# Patient Record
Sex: Female | Born: 2000 | Race: Black or African American | Hispanic: No | Marital: Single | Smoking: Never smoker
Health system: Southern US, Community
[De-identification: ages and names within clinical notes are randomized; demographics above are authoritative.]

## PROBLEM LIST (undated history)

## (undated) ENCOUNTER — Inpatient Hospital Stay: Payer: Self-pay

## (undated) DIAGNOSIS — O368120 Decreased fetal movements, second trimester, not applicable or unspecified: Secondary | ICD-10-CM

## (undated) DIAGNOSIS — M722 Plantar fascial fibromatosis: Secondary | ICD-10-CM

## (undated) DIAGNOSIS — Z87442 Personal history of urinary calculi: Secondary | ICD-10-CM

## (undated) DIAGNOSIS — K219 Gastro-esophageal reflux disease without esophagitis: Secondary | ICD-10-CM

## (undated) DIAGNOSIS — R519 Headache, unspecified: Secondary | ICD-10-CM

## (undated) DIAGNOSIS — F319 Bipolar disorder, unspecified: Secondary | ICD-10-CM

## (undated) DIAGNOSIS — D649 Anemia, unspecified: Secondary | ICD-10-CM

## (undated) DIAGNOSIS — I809 Phlebitis and thrombophlebitis of unspecified site: Secondary | ICD-10-CM

## (undated) DIAGNOSIS — J45909 Unspecified asthma, uncomplicated: Secondary | ICD-10-CM

## (undated) DIAGNOSIS — T8859XA Other complications of anesthesia, initial encounter: Secondary | ICD-10-CM

## (undated) DIAGNOSIS — F419 Anxiety disorder, unspecified: Secondary | ICD-10-CM

## (undated) HISTORY — PX: TONSILLECTOMY: SUR1361

## (undated) HISTORY — DX: Decreased fetal movements, second trimester, not applicable or unspecified: O36.8120

## (undated) HISTORY — DX: Plantar fascial fibromatosis: M72.2

---
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
## 2019-01-18 ENCOUNTER — Encounter: Payer: Medicaid Other | Admitting: Obstetrics and Gynecology

## 2019-02-26 ENCOUNTER — Other Ambulatory Visit: Payer: Self-pay

## 2019-02-26 ENCOUNTER — Emergency Department
Admission: EM | Admit: 2019-02-26 | Discharge: 2019-02-26 | Disposition: A | Discharge status: Home / Self Care | Payer: Medicaid Other | Attending: Emergency Medicine | Admitting: Emergency Medicine

## 2019-02-26 DIAGNOSIS — Z20822 Contact with and (suspected) exposure to covid-19: Secondary | ICD-10-CM | POA: Diagnosis not present

## 2019-02-26 DIAGNOSIS — R0981 Nasal congestion: Secondary | ICD-10-CM | POA: Diagnosis present

## 2019-02-26 DIAGNOSIS — J45909 Unspecified asthma, uncomplicated: Secondary | ICD-10-CM | POA: Insufficient documentation

## 2019-02-26 DIAGNOSIS — R05 Cough: Secondary | ICD-10-CM | POA: Diagnosis not present

## 2019-02-26 DIAGNOSIS — J069 Acute upper respiratory infection, unspecified: Secondary | ICD-10-CM | POA: Diagnosis not present

## 2019-02-26 NOTE — ED Notes (Signed)
This RN to bedside to go over d/c paperwork and do vitals, pt not in room nor is her daughter. Will reassess.

## 2019-02-26 NOTE — ED Notes (Signed)
PT denies trouble breathing or fever

## 2019-02-26 NOTE — ED Provider Notes (Signed)
Emergency Department Provider Note  ____________________________________________  Time seen: Approximately 8:34 PM  I have reviewed the triage vital signs and the nursing notes.   HISTORY  Chief Complaint Nasal Congestion and Cough   Historian Patient    HPI Joanna [name] is a 19 y.o. female presents to the emergency department with nasal congestions, sporadic cough and rhinorrhea for the past 24 hours.  Patient's daughter has similar symptoms.  No fever noted at home.  No chest pain, chest tightness or abdominal pain.  No emesis or diarrhea.  No other alleviating measures have been attempted.   Past Medical History:  Diagnosis Date  . Anxiety   . Asthma   . Bipolar 1 disorder ([place])   . Thrombophlebitis 02/2017   septic pelvic venous thrombophlebitis     Immunizations up to date:  Yes.     Past Medical History:  Diagnosis Date  . Anxiety   . Asthma   . Bipolar 1 disorder ([place])   . Thrombophlebitis 02/2017   septic pelvic venous thrombophlebitis    Patient Active Problem List   Diagnosis Date Noted  . Postoperative wound infection   . Sepsis ([place]) 02/03/2017  . Labor and delivery indication for care or intervention 01/28/2017  . Pregnant 12/24/2016  . Positive urine drug screen 07/09/2016  . Morbid obesity with BMI of 40.0-44.9, adult ([place]) 07/09/2016  . High risk teen pregnancy 07/09/2016  . Asthma 09/27/2012    Past Surgical History:  Procedure Laterality Date  . CESAREAN SECTION N/A 01/28/2017   Procedure: CESAREAN SECTION;  Surgeon: Harlin Heys, MD;  Location: [facility] ORS;  Service: Obstetrics;  Laterality: N/A;  . CYSTOSCOPY W/ RETROGRADES Right 03/26/2017   Procedure: CYSTOSCOPY WITH RETROGRADE PYELOGRAM;  Surgeon: Abbie Sons, MD;  Location: [facility] ORS;  Service: Urology;  Laterality: Right;  . CYSTOSCOPY W/ URETERAL STENT PLACEMENT Right 02/06/2017   Procedure: CYSTOSCOPY WITH RETROGRADE PYELOGRAM/URETERAL STENT PLACEMENT;  Surgeon:  Abbie Sons, MD;  Location: [facility] ORS;  Service: Urology;  Laterality: Right;  . CYSTOSCOPY W/ URETERAL STENT REMOVAL Right 03/26/2017   Procedure: CYSTOSCOPY WITH STENT REMOVAL;  Surgeon: Abbie Sons, MD;  Location: [facility] ORS;  Service: Urology;  Laterality: Right;  . TONSILLECTOMY      Prior to Admission medications   Not on File    Allergies Tylenol [acetaminophen]  Family History  Problem Relation Age of Onset  . Diabetes Mother   . Hypertension Mother   . Cancer Maternal Aunt   . Stroke Maternal Grandmother   . Thyroid disease Maternal Grandmother   . Breast cancer Paternal Grandmother   . Hyperlipidemia Maternal Grandfather   . Diabetes Maternal Grandfather   . Chronic Renal Failure Maternal Grandfather   . Heart Problems Maternal Grandfather        pacemaker   . Hypertension Maternal Grandfather   . Diabetes Paternal Grandfather   . Heart Problems Brother   . Asthma Brother     Social History Social History   Tobacco Use  . Smoking status: Never Smoker  . Smokeless tobacco: Never Used  Substance Use Topics  . Alcohol use: No  . Drug use: No     Review of Systems  Constitutional: No fever/chills Eyes:  No discharge ENT: No upper respiratory complaints. Respiratory: Patient has nasal congestion and sporadic cough. Gastrointestinal:   No nausea, no vomiting.  No diarrhea.  No constipation. Musculoskeletal: Negative for musculoskeletal pain. Skin: Negative for rash, abrasions, lacerations, ecchymosis.    ____________________________________________  PHYSICAL EXAM:  VITAL SIGNS: ED Triage Vitals  Enc Vitals Group     BP 02/26/19 2014 (!) 145/56     Pulse Rate 02/26/19 2014 90     Resp 02/26/19 2014 18     Temp 02/26/19 2014 98.6 F (37 C)     Temp Source 02/26/19 2014 Oral     SpO2 02/26/19 2014 100 %     Weight 02/26/19 2013 (!) 313 lb (142 kg)     Height 02/26/19 2013 5\' 10"  (1.778 m)     Head Circumference --      Peak Flow --       Pain Score 02/26/19 2013 0     Pain Loc --      Pain Edu? --      Excl. in [place]? --      Constitutional: Alert and oriented. Patient is lying supine. Eyes: Conjunctivae are normal. PERRL. EOMI. Head: Atraumatic. ENT:      Ears: Tympanic membranes are mildly injected with mild effusion bilaterally.       Nose: No congestion/rhinnorhea.      Mouth/Throat: Mucous membranes are moist. Posterior pharynx is mildly erythematous.  Hematological/Lymphatic/Immunilogical: No cervical lymphadenopathy.  Cardiovascular: Normal rate, regular rhythm. Normal S1 and S2.  Good peripheral circulation. Respiratory: Normal respiratory effort without tachypnea or retractions. Lungs CTAB. Good air entry to the bases with no decreased or absent breath sounds. Gastrointestinal: Bowel sounds 4 quadrants. Soft and nontender to palpation. No guarding or rigidity. No palpable masses. No distention. No CVA tenderness. Musculoskeletal: Full range of motion to all extremities. No gross deformities appreciated. Neurologic:  Normal speech and language. No gross focal neurologic deficits are appreciated.  Skin:  Skin is warm, dry and intact. No rash noted. Psychiatric: Mood and affect are normal. Speech and behavior are normal. Patient exhibits appropriate insight and judgement.   ____________________________________________   LABS (all labs ordered are listed, but only abnormal results are displayed)  Labs Reviewed  SARS CORONAVIRUS 2 (TAT 6-24 HRS)   ____________________________________________  EKG   ____________________________________________  RADIOLOGY   No results found.  ____________________________________________    PROCEDURES  Procedure(s) performed:     Procedures     Medications - No data to display   ____________________________________________   INITIAL IMPRESSION / ASSESSMENT AND PLAN / ED COURSE  Pertinent labs & imaging results that were available during my care of  the patient were reviewed by me and considered in my medical decision making (see chart for details).      Assessment and plan Need for COVID-19 testing. 19 year old female presents to the emergency department with rhinorrhea, nasal congestion and sporadic cough for the past 24 hours requesting COVID-19 testing.  Vital signs are reassuring at triage.  Sent off COVID-19 testing is pending at this time.  Quarantine precautions were given.  All patient questions were answered.    ____________________________________________  FINAL CLINICAL IMPRESSION(S) / ED DIAGNOSES  Final diagnoses:  None      NEW MEDICATIONS STARTED DURING THIS VISIT:  ED Discharge Orders    None          This chart was dictated using voice recognition software/Dragon. Despite best efforts to proofread, errors can occur which can change the meaning. Any change was purely unintentional.     Karren Cobble 02/26/19 2039    Arta Silence, MD 02/26/19 2229

## 2019-02-26 NOTE — ED Triage Notes (Signed)
Patient reports runny nose and cough that started yesterday.

## 2019-02-27 LAB — SARS CORONAVIRUS 2 (TAT 6-24 HRS): SARS Coronavirus 2: NEGATIVE

## 2019-08-08 ENCOUNTER — Emergency Department
Admission: EM | Admit: 2019-08-08 | Discharge: 2019-08-08 | Disposition: A | Discharge status: Home / Self Care | Payer: Medicaid Other | Attending: Emergency Medicine | Admitting: Emergency Medicine

## 2019-08-08 ENCOUNTER — Other Ambulatory Visit: Payer: Self-pay

## 2019-08-08 ENCOUNTER — Encounter: Payer: Self-pay | Admitting: Emergency Medicine

## 2019-08-08 DIAGNOSIS — R519 Headache, unspecified: Secondary | ICD-10-CM | POA: Diagnosis present

## 2019-08-08 DIAGNOSIS — K219 Gastro-esophageal reflux disease without esophagitis: Secondary | ICD-10-CM | POA: Diagnosis not present

## 2019-08-08 DIAGNOSIS — G44209 Tension-type headache, unspecified, not intractable: Secondary | ICD-10-CM | POA: Diagnosis not present

## 2019-08-08 DIAGNOSIS — J45909 Unspecified asthma, uncomplicated: Secondary | ICD-10-CM | POA: Diagnosis not present

## 2019-08-08 DIAGNOSIS — H53149 Visual discomfort, unspecified: Secondary | ICD-10-CM | POA: Diagnosis not present

## 2019-08-08 LAB — COMPREHENSIVE METABOLIC PANEL
ALT: 11 U/L (ref 0–44)
AST: 12 U/L — ABNORMAL LOW (ref 15–41)
Albumin: 3.6 g/dL (ref 3.5–5.0)
Alkaline Phosphatase: 59 U/L (ref 38–126)
Anion gap: 6 (ref 5–15)
BUN: 13 mg/dL (ref 6–20)
CO2: 27 mmol/L (ref 22–32)
Calcium: 8.7 mg/dL — ABNORMAL LOW (ref 8.9–10.3)
Chloride: 106 mmol/L (ref 98–111)
Creatinine, Ser: 0.75 mg/dL (ref 0.44–1.00)
GFR calc Af Amer: >60 mL/min (ref >60)
GFR calc non Af Amer: >60 mL/min (ref >60)
Glucose, Bld: 105 mg/dL — ABNORMAL HIGH (ref 70–99)
Potassium: 4 mmol/L (ref 3.5–5.1)
Sodium: 139 mmol/L (ref 135–145)
Total Bilirubin: 0.6 mg/dL (ref 0.3–1.2)
Total Protein: 6.8 g/dL (ref 6.5–8.1)

## 2019-08-08 LAB — CBC
HCT: 34.1 % — ABNORMAL LOW (ref 36.0–46.0)
Hemoglobin: 11.2 g/dL — ABNORMAL LOW (ref 12.0–15.0)
MCH: 25.9 pg — ABNORMAL LOW (ref 26.0–34.0)
MCHC: 32.8 g/dL (ref 30.0–36.0)
MCV: 78.9 fL — ABNORMAL LOW (ref 80.0–100.0)
Platelets: 242 10*3/uL (ref 150–400)
RBC: 4.32 MIL/uL (ref 3.87–5.11)
RDW: 17.3 % — ABNORMAL HIGH (ref 11.5–15.5)
WBC: 5.9 10*3/uL (ref 4.0–10.5)
nRBC: 0 % (ref 0.0–0.2)

## 2019-08-08 LAB — URINALYSIS, COMPLETE (UACMP) WITH MICROSCOPIC
Bacteria, UA: NONE SEEN
Bilirubin Urine: NEGATIVE
Glucose, UA: NEGATIVE mg/dL
Hgb urine dipstick: NEGATIVE
Ketones, ur: NEGATIVE mg/dL
Leukocytes,Ua: NEGATIVE
Nitrite: NEGATIVE
Protein, ur: NEGATIVE mg/dL
Specific Gravity, Urine: 1.017 (ref 1.005–1.030)
pH: 7 (ref 5.0–8.0)

## 2019-08-08 LAB — LIPASE, BLOOD: Lipase: 21 U/L (ref 11–51)

## 2019-08-08 LAB — POCT PREGNANCY, URINE: Preg Test, Ur: NEGATIVE

## 2019-08-08 MED ORDER — FAMOTIDINE 20 MG PO TABS: 20.0000 mg | ORAL_TABLET | Freq: Two times a day (BID) | ORAL | 1 refills | Status: DC

## 2019-08-08 MED ORDER — DIAZEPAM 2 MG PO TABS: 2.0000 mg | ORAL_TABLET | Freq: Every evening | ORAL | 0 refills | Status: DC

## 2019-08-08 MED ORDER — KETOROLAC TROMETHAMINE 60 MG/2ML IM SOLN
60.0000 mg | Freq: Once | INTRAMUSCULAR | Status: AC
  Administered 2019-08-08: 60 mg via INTRAMUSCULAR
  Filled 2019-08-08: qty 2

## 2019-08-08 MED ORDER — DICYCLOMINE HCL 10 MG/5ML PO SOLN
10.0000 mg | Freq: Once | ORAL | Status: AC
  Administered 2019-08-08: 10 mg via ORAL
  Filled 2019-08-08: qty 5

## 2019-08-08 MED ORDER — LIDOCAINE VISCOUS HCL 2 % MT SOLN
15.0000 mL | Freq: Once | OROMUCOSAL | Status: AC
  Administered 2019-08-08: 15 mL via ORAL
  Filled 2019-08-08: qty 15

## 2019-08-08 MED ORDER — ALUM & MAG HYDROXIDE-SIMETH 200-200-20 MG/5ML PO SUSP
30.0000 mL | Freq: Once | ORAL | Status: AC
  Administered 2019-08-08: 30 mL via ORAL
  Filled 2019-08-08: qty 30

## 2019-08-08 NOTE — ED Provider Notes (Signed)
[facility] Emergency Department Provider Note   ____________________________________________   First MD Initiated Contact with Patient 08/08/19 1231     (approximate)  I have reviewed the triage vital signs and the nursing notes.   HISTORY  Chief Complaint Headache    HPI Joanna [name] is a 19 y.o. female who complains of burning epigastric pain for the last day or so.  It is mild to moderate in the epigastric area only there is no nausea vomiting or diarrhea with it is not crampy.  She says she uses herbs and sometimes this seems to help a little bit.  She also has had a headache for the last 4 days it is kind of diffuse tension any kind of headache radiating down towards the neck.  It is not severe 3 or 4 out of 10.  She has a slight amount of photophobia with it but no fever no pain with eye movements neck is supple there is no pain in her neck.  Her herbal treatments have not helped the headache.         Past Medical History:  Diagnosis Date  . Anxiety   . Asthma   . Bipolar 1 disorder ([place])   . Thrombophlebitis 02/2017   septic pelvic venous thrombophlebitis    Patient Active Problem List   Diagnosis Date Noted  . Postoperative wound infection   . Sepsis ([place]) 02/03/2017  . Labor and delivery indication for care or intervention 01/28/2017  . Pregnant 12/24/2016  . Positive urine drug screen 07/09/2016  . Morbid obesity with BMI of 40.0-44.9, adult ([place]) 07/09/2016  . High risk teen pregnancy 07/09/2016  . Asthma 09/27/2012    Past Surgical History:  Procedure Laterality Date  . CESAREAN SECTION N/A 01/28/2017   Procedure: CESAREAN SECTION;  Surgeon: Harlin Heys, MD;  Location: [facility] ORS;  Service: Obstetrics;  Laterality: N/A;  . CYSTOSCOPY W/ RETROGRADES Right 03/26/2017   Procedure: CYSTOSCOPY WITH RETROGRADE PYELOGRAM;  Surgeon: Abbie Sons, MD;  Location: [facility] ORS;  Service: Urology;  Laterality: Right;  .  CYSTOSCOPY W/ URETERAL STENT PLACEMENT Right 02/06/2017   Procedure: CYSTOSCOPY WITH RETROGRADE PYELOGRAM/URETERAL STENT PLACEMENT;  Surgeon: Abbie Sons, MD;  Location: [facility] ORS;  Service: Urology;  Laterality: Right;  . CYSTOSCOPY W/ URETERAL STENT REMOVAL Right 03/26/2017   Procedure: CYSTOSCOPY WITH STENT REMOVAL;  Surgeon: Abbie Sons, MD;  Location: [facility] ORS;  Service: Urology;  Laterality: Right;  . TONSILLECTOMY      Prior to Admission medications   Not on File    Allergies Tylenol [acetaminophen]  Family History  Problem Relation Age of Onset  . Diabetes Mother   . Hypertension Mother   . Cancer Maternal Aunt   . Stroke Maternal Grandmother   . Thyroid disease Maternal Grandmother   . Breast cancer Paternal Grandmother   . Hyperlipidemia Maternal Grandfather   . Diabetes Maternal Grandfather   . Chronic Renal Failure Maternal Grandfather   . Heart Problems Maternal Grandfather        pacemaker   . Hypertension Maternal Grandfather   . Diabetes Paternal Grandfather   . Heart Problems Brother   . Asthma Brother     Social History Social History   Tobacco Use  . Smoking status: Never Smoker  . Smokeless tobacco: Never Used  Vaping Use  . Vaping Use: Never used  Substance Use Topics  . Alcohol use: No  . Drug use: No    Review of  Systems  Constitutional: No fever/chills Eyes: No visual changes. ENT: No sore throat. Cardiovascular: Denies chest pain. Respiratory: Denies shortness of breath. Gastrointestinal: No abdominal pain.  No nausea, no vomiting.  No diarrhea.  No constipation. Genitourinary: Negative for dysuria. Musculoskeletal: Negative for back pain. Skin: Negative for rash. Neurological: Negative for headaches, focal weakness  ____________________________________________   PHYSICAL EXAM:  VITAL SIGNS: ED Triage Vitals  Enc Vitals Group     BP 08/08/19 1055 (!) 107/88     Pulse Rate 08/08/19 1055 71     Resp 08/08/19 1055 18      Temp 08/08/19 1055 98.2 F (36.8 C)     Temp Source 08/08/19 1055 Oral     SpO2 08/08/19 1055 99 %     Weight 08/08/19 1056 (!) 356 lb (161.5 kg)     Height 08/08/19 1056 5\' 9"  (1.753 m)     Head Circumference --      Peak Flow --      Pain Score 08/08/19 1055 7     Pain Loc --      Pain Edu? --      Excl. in [place]? --     Constitutional: Alert and oriented. Well appearing and in no acute distress. Eyes: Conjunctivae are normal. PERRL. EOMI. fundi appear normal Head: Atraumatic. Nose: No congestion/rhinnorhea. Mouth/Throat: Mucous membranes are moist.  Oropharynx non-erythematous. Neck: No stridor.  Cardiovascular: Normal rate, regular rhythm. Grossly normal heart sounds.  Good peripheral circulation. Respiratory: Normal respiratory effort.  No retractions. Lungs CTAB. Gastrointestinal: Soft a palpation in the epigastric area somewhat tender there is no other tenderness to palpation or percussion no distention. No abdominal bruits.  Musculoskeletal: No lower extremity tenderness nor edema.   Neurologic:  Normal speech and language. No gross focal neurologic deficits are appreciated.  Cranial nerves II through XII are intact although visual fields were not checked cerebellar finger-nose rapid alternating movements and hands are normal patient does not report any numbness and motor strength is 5/5 throughout. Skin:  Skin is warm, dry and intact. No rash noted.   ____________________________________________   LABS (all labs ordered are listed, but only abnormal results are displayed)  Labs Reviewed  COMPREHENSIVE METABOLIC PANEL - Abnormal; Notable for the following components:      Result Value   Glucose, Bld 105 (*)    Calcium 8.7 (*)    AST 12 (*)    All other components within normal limits  CBC - Abnormal; Notable for the following components:   Hemoglobin 11.2 (*)    HCT 34.1 (*)    MCV 78.9 (*)    MCH 25.9 (*)    RDW 17.3 (*)    All other components within normal  limits  URINALYSIS, COMPLETE (UACMP) WITH MICROSCOPIC - Abnormal; Notable for the following components:   Color, Urine YELLOW (*)    APPearance HAZY (*)    All other components within normal limits  LIPASE, BLOOD  POC URINE PREG, ED  POCT PREGNANCY, URINE   ____________________________________________  EKG   ____________________________________________  RADIOLOGY  ED MD interpretation:    Official radiology report(s): No results found.  ____________________________________________   PROCEDURES  Procedure(s) performed (including Critical Care):  Procedures   ____________________________________________   INITIAL IMPRESSION / ASSESSMENT AND PLAN / ED COURSE  Patient's burning epigastric pain is not made worse by palpation it is in the epigastric area not in the right upper quadrant and she has a normal set of liver function test so I  do not believe that there is any liver or gallbladder problem involved in this.  She is not short of breath and has no chest pain so it is unlikely at her age if she has any cardiac issues either.  This is most likely reflux.  Her headache is dull and achy.  She has no fever nausea or vomiting.  There is no reason to be suspicious for meningitis.  Her fundi are normal with no papilledema.  Neurological exam is also normal.  Review of her old records does not show any episodes of migraine headache.  This does not sound like a migraine headache is lasted through several nights when she slept.  Patient is not nauseated or vomiting.  There is no reason to suspect any intracranial pathology with a normal neuro exam and no papilledema.  Most likely it is a tension headache.  We will try some GI cocktail and Toradol to see if this helps with her problems.  If they do we will let her go with some Tylenol for the headache and Pepcid for a few days for the reflux.  Additionally we will give her some reflux  instructions. ----------------------------------------- 1:55 PM on 08/08/2019 -----------------------------------------  Patient reports her epigastric pain is gone.  Her headache is somewhat better.  She now has some pain in the neck.  On palpation of her neck her neck muscles are extremely tense.  Palpation of the neck muscles and shoulder area/trapezius is tender and shows that her muscles are very tense.  Discussed with her.  We will give her a low-dose of Valium to take before bed to see if that helps for the next couple nights.  We will also give her some Pepcid for her stomach.             ____________________________________________   FINAL CLINICAL IMPRESSION(S) / ED DIAGNOSES  Final diagnoses:  Gastroesophageal reflux disease, unspecified whether esophagitis present  Tension headache     ED Discharge Orders    None       Note:  This document was prepared using Dragon voice recognition software and may include unintentional dictation errors.    Nena Polio, MD 08/08/19 1356

## 2019-08-08 NOTE — ED Triage Notes (Signed)
Here for headache for 4 days.  Also c/o abdominal cramping and has missed period this month and did not have normal period last month. Has not done  Home pregnancy test. NAD. VSS. No fever. + nausea without vomiting.

## 2019-08-08 NOTE — ED Notes (Signed)
See triage note  Presents with a 4 day hx of headache  States pain is mainly frontal and at both temporal areas denies any fever    Ha had some nausea

## 2019-08-08 NOTE — Discharge Instructions (Addendum)
Use Tylenol as needed for the headache.  Use the Pepcid 1 twice a day for the epigastric/upper stomach pain.  For that muscle tightness in your neck I will have you take 1 Valium before bed every night.  We will do this for 3 nights.  That should help.  If you take it before bed you should be okay during the day.  If you are sleepy or groggy do not drive or operate hazardous machinery.  Please return if worse or no better in 2 to 3 days.  Follow-up with your regular doctor.

## 2019-09-05 ENCOUNTER — Encounter: Payer: Medicaid Other | Admitting: Obstetrics and Gynecology

## 2019-09-21 ENCOUNTER — Encounter: Payer: Self-pay | Admitting: Obstetrics and Gynecology

## 2019-12-04 ENCOUNTER — Encounter: Payer: Self-pay | Admitting: Emergency Medicine

## 2019-12-04 ENCOUNTER — Emergency Department
Admission: EM | Admit: 2019-12-04 | Discharge: 2019-12-04 | Disposition: A | Discharge status: Home / Self Care | Payer: Medicaid Other | Attending: Emergency Medicine | Admitting: Emergency Medicine

## 2019-12-04 ENCOUNTER — Emergency Department: Payer: Medicaid Other

## 2019-12-04 ENCOUNTER — Other Ambulatory Visit: Payer: Self-pay

## 2019-12-04 DIAGNOSIS — R109 Unspecified abdominal pain: Secondary | ICD-10-CM | POA: Insufficient documentation

## 2019-12-04 DIAGNOSIS — J45909 Unspecified asthma, uncomplicated: Secondary | ICD-10-CM | POA: Diagnosis not present

## 2019-12-04 DIAGNOSIS — O219 Vomiting of pregnancy, unspecified: Secondary | ICD-10-CM | POA: Diagnosis not present

## 2019-12-04 DIAGNOSIS — R059 Cough, unspecified: Secondary | ICD-10-CM | POA: Insufficient documentation

## 2019-12-04 DIAGNOSIS — Z20822 Contact with and (suspected) exposure to covid-19: Secondary | ICD-10-CM | POA: Insufficient documentation

## 2019-12-04 DIAGNOSIS — R52 Pain, unspecified: Secondary | ICD-10-CM

## 2019-12-04 DIAGNOSIS — Z3A01 Less than 8 weeks gestation of pregnancy: Secondary | ICD-10-CM | POA: Insufficient documentation

## 2019-12-04 DIAGNOSIS — R531 Weakness: Secondary | ICD-10-CM | POA: Insufficient documentation

## 2019-12-04 DIAGNOSIS — O26891 Other specified pregnancy related conditions, first trimester: Secondary | ICD-10-CM | POA: Diagnosis present

## 2019-12-04 DIAGNOSIS — Z3491 Encounter for supervision of normal pregnancy, unspecified, first trimester: Secondary | ICD-10-CM

## 2019-12-04 LAB — URINALYSIS, COMPLETE (UACMP) WITH MICROSCOPIC
Bilirubin Urine: NEGATIVE
Glucose, UA: NEGATIVE mg/dL
Hgb urine dipstick: NEGATIVE
Ketones, ur: 5 mg/dL — AB
Nitrite: NEGATIVE
Protein, ur: NEGATIVE mg/dL
Specific Gravity, Urine: 1.024 (ref 1.005–1.030)
pH: 6 (ref 5.0–8.0)

## 2019-12-04 LAB — CBC
HCT: 34.2 % — ABNORMAL LOW (ref 36.0–46.0)
Hemoglobin: 11 g/dL — ABNORMAL LOW (ref 12.0–15.0)
MCH: 25.7 pg — ABNORMAL LOW (ref 26.0–34.0)
MCHC: 32.2 g/dL (ref 30.0–36.0)
MCV: 79.9 fL — ABNORMAL LOW (ref 80.0–100.0)
Platelets: 220 10*3/uL (ref 150–400)
RBC: 4.28 MIL/uL (ref 3.87–5.11)
RDW: 16.7 % — ABNORMAL HIGH (ref 11.5–15.5)
WBC: 7 10*3/uL (ref 4.0–10.5)
nRBC: 0 % (ref 0.0–0.2)

## 2019-12-04 LAB — RESP PANEL BY RT-PCR (FLU A&B, COVID) ARPGX2
Influenza A by PCR: NEGATIVE
Influenza B by PCR: NEGATIVE
SARS Coronavirus 2 by RT PCR: NEGATIVE

## 2019-12-04 LAB — POC URINE PREG, ED: Preg Test, Ur: POSITIVE — AB

## 2019-12-04 LAB — HCG, QUANTITATIVE, PREGNANCY: hCG, Beta Chain, Quant, S: 152025 m[IU]/mL — ABNORMAL HIGH (ref <5)

## 2019-12-04 LAB — ABO/RH: ABO/RH(D): B POS

## 2019-12-04 NOTE — ED Triage Notes (Signed)
Says feeling bad since last Thursday or Friday with cough, feeling weak, nauseated, vomiting all week after eating.  Not sure if she is pregnant.  Also got second dose of covid vaccine 11/9.  She sounds nasally congested.

## 2019-12-04 NOTE — Discharge Instructions (Addendum)
Follow-up with the [facility], your regular OB/GYN, or Dr. Marcelline Mates.  You could also follow-up at [facility] if desired.  If you plan to keep the pregnancy please take over-the-counter prenatal pills.

## 2019-12-04 NOTE — ED Provider Notes (Signed)
[facility] Emergency Department Provider Note  ____________________________________________   First MD Initiated Contact with Patient 12/04/19 1518     (approximate)  I have reviewed the triage vital signs and the nursing notes.   HISTORY  Chief Complaint Cough, Weakness, and Shortness of Breath    HPI Joanna [name] is a 19 y.o. female presents emergency department complaining of cough, feeling weak and nauseated, vomiting all week after eating.  She denies any fever or chills.  Patient states her last period was in September.  History of unprotected sex.  States she does not want to be pregnant.  She has had minimal abdominal pain.  She has not had any prenatal care or any pregnancy test so far.    Past Medical History:  Diagnosis Date  . Anxiety   . Asthma   . Bipolar 1 disorder ([place])   . Thrombophlebitis 02/2017   septic pelvic venous thrombophlebitis    Patient Active Problem List   Diagnosis Date Noted  . Postoperative wound infection   . Sepsis ([place]) 02/03/2017  . Labor and delivery indication for care or intervention 01/28/2017  . Pregnant 12/24/2016  . Positive urine drug screen 07/09/2016  . Morbid obesity with BMI of 40.0-44.9, adult ([place]) 07/09/2016  . High risk teen pregnancy 07/09/2016  . Asthma 09/27/2012    Past Surgical History:  Procedure Laterality Date  . CESAREAN SECTION N/A 01/28/2017   Procedure: CESAREAN SECTION;  Surgeon: Harlin Heys, MD;  Location: [facility] ORS;  Service: Obstetrics;  Laterality: N/A;  . CYSTOSCOPY W/ RETROGRADES Right 03/26/2017   Procedure: CYSTOSCOPY WITH RETROGRADE PYELOGRAM;  Surgeon: Abbie Sons, MD;  Location: [facility] ORS;  Service: Urology;  Laterality: Right;  . CYSTOSCOPY W/ URETERAL STENT PLACEMENT Right 02/06/2017   Procedure: CYSTOSCOPY WITH RETROGRADE PYELOGRAM/URETERAL STENT PLACEMENT;  Surgeon: Abbie Sons, MD;  Location: [facility] ORS;  Service: Urology;  Laterality: Right;  .  CYSTOSCOPY W/ URETERAL STENT REMOVAL Right 03/26/2017   Procedure: CYSTOSCOPY WITH STENT REMOVAL;  Surgeon: Abbie Sons, MD;  Location: [facility] ORS;  Service: Urology;  Laterality: Right;  . TONSILLECTOMY      Prior to Admission medications   Medication Sig Start Date End Date Taking? Authorizing Provider  diazepam (VALIUM) 2 MG tablet Take 1 tablet (2 mg total) by mouth at bedtime. 08/08/19 08/07/20  Nena Polio, MD  famotidine (PEPCID) 20 MG tablet Take 1 tablet (20 mg total) by mouth 2 (two) times daily. 08/08/19 08/07/20  Nena Polio, MD    Allergies Tylenol [acetaminophen]  Family History  Problem Relation Age of Onset  . Diabetes Mother   . Hypertension Mother   . Cancer Maternal Aunt   . Stroke Maternal Grandmother   . Thyroid disease Maternal Grandmother   . Breast cancer Paternal Grandmother   . Hyperlipidemia Maternal Grandfather   . Diabetes Maternal Grandfather   . Chronic Renal Failure Maternal Grandfather   . Heart Problems Maternal Grandfather        pacemaker   . Hypertension Maternal Grandfather   . Diabetes Paternal Grandfather   . Heart Problems Brother   . Asthma Brother     Social History Social History   Tobacco Use  . Smoking status: Never Smoker  . Smokeless tobacco: Never Used  Vaping Use  . Vaping Use: Never used  Substance Use Topics  . Alcohol use: No  . Drug use: No    Review of Systems  Constitutional: No fever/chills Eyes:  No visual changes. ENT: No sore throat. Respiratory: Denies cough Cardiovascular: Denies chest pain Gastrointestinal: Denies abdominal pain Genitourinary: Negative for dysuria. Musculoskeletal: Negative for back pain. Skin: Negative for rash. Psychiatric: no mood changes,     ____________________________________________   PHYSICAL EXAM:  VITAL SIGNS: ED Triage Vitals  Enc Vitals Group     BP 12/04/19 1419 (!) 144/64     Pulse Rate 12/04/19 1419 63     Resp 12/04/19 1419 14     Temp 12/04/19  1419 98.4 F (36.9 C)     Temp Source 12/04/19 1419 Oral     SpO2 12/04/19 1419 100 %     Weight 12/04/19 1420 (!) 308 lb (139.7 kg)     Height 12/04/19 1420 5\' 9"  (1.753 m)     Head Circumference --      Peak Flow --      Pain Score 12/04/19 1420 0     Pain Loc --      Pain Edu? --      Excl. in [place]? --     Constitutional: Alert and oriented. Well appearing and in no acute distress. Eyes: Conjunctivae are normal.  Head: Atraumatic. Nose: No congestion/rhinnorhea. Mouth/Throat: Mucous membranes are moist.   Neck:  supple no lymphadenopathy noted Cardiovascular: Normal rate, regular rhythm. Heart sounds are normal Respiratory: Normal respiratory effort.  No retractions, lungs c t a  Abd: soft minimally tender in lower quadrants bilaterally, bs normal all 4 quad GU: deferred Musculoskeletal: FROM all extremities, warm and well perfused Neurologic:  Normal speech and language.  Skin:  Skin is warm, dry and intact. No rash noted. Psychiatric: Mood and affect are normal. Speech and behavior are normal.  ____________________________________________   LABS (all labs ordered are listed, but only abnormal results are displayed)  Labs Reviewed  URINALYSIS, COMPLETE (UACMP) WITH MICROSCOPIC - Abnormal; Notable for the following components:      Result Value   Color, Urine YELLOW (*)    APPearance HAZY (*)    Ketones, ur 5 (*)    Leukocytes,Ua TRACE (*)    Bacteria, UA RARE (*)    All other components within normal limits  CBC - Abnormal; Notable for the following components:   Hemoglobin 11.0 (*)    HCT 34.2 (*)    MCV 79.9 (*)    MCH 25.7 (*)    RDW 16.7 (*)    All other components within normal limits  HCG, QUANTITATIVE, PREGNANCY - Abnormal; Notable for the following components:   hCG, Beta Chain, Quant, S 152,025 (*)    All other components within normal limits  POC URINE PREG, ED - Abnormal; Notable for the following components:   Preg Test, Ur POSITIVE (*)    All  other components within normal limits  RESP PANEL BY RT-PCR (FLU A&B, COVID) ARPGX2  ABO/RH   ____________________________________________   ____________________________________________  RADIOLOGY  Ultrasound OB less than 14 weeks  ____________________________________________   PROCEDURES  Procedure(s) performed: No  Procedures    ____________________________________________   INITIAL IMPRESSION / ASSESSMENT AND PLAN / ED COURSE  Pertinent labs & imaging results that were available during my care of the patient were reviewed by me and considered in my medical decision making (see chart for details).   Patient is 19 year old female presents with nausea and weakness.  Some cough.  Concern for Covid in pregnancy.  See HPI.  Physical exam shows minimal tenderness in the base lower quadrants.  DDx: Covid, early pregnancy, ectopic  pregnancy, URI, gastrointestinal infection   POC pregnancy is positive, CBC has decreased H&H at 11/34.2, urinalysis has trace leuks with rare bacteria  Beta-hCG is 152,025, ABO/Rh and respiratory panel still pending Ordered ultrasound OB less than 14 weeks    Respiratory panel is negative.  Patient is B+ so she does not need RhoGam.  Ultrasound reviewed by me does show an IUP with fetal heart tones.  Awaiting radiologist read.  Ultrasound does show a 6-week viable IUP.  I explained everything to the patient.  Due to her being very upset and not sure that she wants to keep the pregnancy we did discuss her options.  She was given phone numbers for the [facility], [facility], and [facility].  I did encourage her to speak with her GYN about her feelings and follow-up with her do the pregnancy.  I also encouraged her that if she is going to continue the pregnancy to take over-the-counter prenatal vitamins.  She can take over-the-counter medications likely just for nausea/vomiting.  Return to the emergency  department if worsening.  States she understands.  She discharged stable condition.  Also her Covid/respiratory panel was negative.  BEN SANZ was evaluated in Emergency Department on 12/04/2019 for the symptoms described in the history of present illness. She was evaluated in the context of the global COVID-19 pandemic, which necessitated consideration that the patient might be at risk for infection with the SARS-CoV-2 virus that causes COVID-19. Institutional protocols and algorithms that pertain to the evaluation of patients at risk for COVID-19 are in a state of rapid change based on information released by regulatory bodies including the CDC and federal and state organizations. These policies and algorithms were followed during the patient's care in the ED.    As part of my medical decision making, I reviewed the following data within the [facility] notes reviewed and incorporated, Labs reviewed , Old chart reviewed, Radiograph reviewed , Notes from prior ED visits and [place] Controlled Substance Database  ____________________________________________   FINAL CLINICAL IMPRESSION(S) / ED DIAGNOSES  Final diagnoses:  First trimester pregnancy      NEW MEDICATIONS STARTED DURING THIS VISIT:  Discharge Medication List as of 12/04/2019  7:16 PM       Note:  This document was prepared using Dragon voice recognition software and may include unintentional dictation errors.    Versie Starks, PA-C 12/04/19 2051    Nance Pear, MD 12/04/19 2201

## 2020-01-04 ENCOUNTER — Ambulatory Visit (INDEPENDENT_AMBULATORY_CARE_PROVIDER_SITE_OTHER): Payer: Medicaid Other | Admitting: Obstetrics and Gynecology

## 2020-01-04 ENCOUNTER — Other Ambulatory Visit: Payer: Self-pay

## 2020-01-04 ENCOUNTER — Encounter: Payer: Self-pay | Admitting: Obstetrics and Gynecology

## 2020-01-04 VITALS — BP 119/65 | HR 67 | Wt 327.9 lb

## 2020-01-04 DIAGNOSIS — Z6841 Body Mass Index (BMI) 40.0 and over, adult: Secondary | ICD-10-CM

## 2020-01-04 DIAGNOSIS — O34219 Maternal care for unspecified type scar from previous cesarean delivery: Secondary | ICD-10-CM | POA: Diagnosis not present

## 2020-01-04 DIAGNOSIS — N912 Amenorrhea, unspecified: Secondary | ICD-10-CM | POA: Diagnosis not present

## 2020-01-04 DIAGNOSIS — O0992 Supervision of high risk pregnancy, unspecified, second trimester: Secondary | ICD-10-CM | POA: Diagnosis not present

## 2020-01-04 LAB — OB RESULTS CONSOLE VARICELLA ZOSTER ANTIBODY, IGG: Varicella: NON-IMMUNE/NOT IMMUNE

## 2020-01-04 NOTE — Progress Notes (Signed)
HPI:      Joanna [name] is a 19 y.o. G1P1001 who LMP was Patient's last menstrual period was 09/30/2019 (approximate).  Subjective:   She presents today to establish prenatal care.  Based on the emergency department ultrasound she is approximately [redacted] weeks pregnant.  She was attempting pregnancy.  She is currently taking prenatal vitamins. She has no complaints today.    Hx: The following portions of the patient's history were reviewed and updated as appropriate:             She  has a past medical history of Anxiety, Asthma, Bipolar 1 disorder (HCC), and Thrombophlebitis (02/2017). She does not have any pertinent problems on file. She  has a past surgical history that includes Tonsillectomy; Cesarean section (N/A, 01/28/2017); Cystoscopy w/ ureteral stent placement (Right, 02/06/2017); Cystoscopy w/ ureteral stent removal (Right, 03/26/2017); and Cystoscopy w/ retrogrades (Right, 03/26/2017). Her family history includes Asthma in her brother; Breast cancer in her paternal grandmother; Cancer in her maternal aunt; Chronic Renal Failure in her maternal grandfather; Diabetes in her maternal grandfather, mother, and paternal grandfather; Heart Problems in her brother and maternal grandfather; Hyperlipidemia in her maternal grandfather; Hypertension in her maternal grandfather and mother; Stroke in her maternal grandmother; Thyroid disease in her maternal grandmother. She  reports that she has never smoked. She has never used smokeless tobacco. She reports that she does not drink alcohol and does not use drugs. She has a current medication list which includes the following prescription(s): diazepam and famotidine. She is allergic to tylenol [acetaminophen].       Review of Systems:  Review of Systems  Constitutional: Denied constitutional symptoms, night sweats, recent illness, fatigue, fever, insomnia and weight loss.  Eyes: Denied eye symptoms, eye pain, photophobia, vision change and visual  disturbance.  Ears/Nose/Throat/Neck: Denied ear, nose, throat or neck symptoms, hearing loss, nasal discharge, sinus congestion and sore throat.  Cardiovascular: Denied cardiovascular symptoms, arrhythmia, chest pain/pressure, edema, exercise intolerance, orthopnea and palpitations.  Respiratory: Denied pulmonary symptoms, asthma, pleuritic pain, productive sputum, cough, dyspnea and wheezing.  Gastrointestinal: Denied, gastro-esophageal reflux, melena, nausea and vomiting.  Genitourinary: Denied genitourinary symptoms including symptomatic vaginal discharge, pelvic relaxation issues, and urinary complaints.  Musculoskeletal: Denied musculoskeletal symptoms, stiffness, swelling, muscle weakness and myalgia.  Dermatologic: Denied dermatology symptoms, rash and scar.  Neurologic: Denied neurology symptoms, dizziness, headache, neck pain and syncope.  Psychiatric: Denied psychiatric symptoms, anxiety and depression.  Endocrine: Denied endocrine symptoms including hot flashes and night sweats.   Meds:   Current Outpatient Medications on File Prior to Visit  Medication Sig Dispense Refill  . diazepam (VALIUM) 2 MG tablet Take 1 tablet (2 mg total) by mouth at bedtime. 3 tablet 0  . famotidine (PEPCID) 20 MG tablet Take 1 tablet (20 mg total) by mouth 2 (two) times daily. 20 tablet 1   No current facility-administered medications on file prior to visit.          Objective:     Vitals:   01/04/20 0928  BP: 119/65  Pulse: 67   Filed Weights   01/04/20 0928  Weight: (!) 327 lb 14.4 oz (148.7 kg)              Physical examination General NAD, Conversant  HEENT Atraumatic; Op clear with mmm.  Normo-cephalic. Pupils reactive. Anicteric sclerae  Thyroid/Neck Smooth without nodularity or enlargement. Normal ROM.  Neck Supple.  Skin No rashes, lesions or ulceration. Normal palpated skin turgor. No nodularity.  Breasts:  No masses or discharge.  Symmetric.  No axillary adenopathy.  Lungs:  Clear to auscultation.No rales or wheezes. Normal Respiratory effort, no retractions.  Heart: NSR.  No murmurs or rubs appreciated. No periferal edema  Abdomen: Soft.  Non-tender.  No masses.  No HSM. No hernia  Extremities: Moves all appropriately.  Normal ROM for age. No lymphadenopathy.  Neuro: Oriented to PPT.  Normal mood. Normal affect.     Pelvic:   Vulva: Normal appearance.  No lesions.  Vagina: No lesions or abnormalities noted.  Support: Normal pelvic support.  Urethra No masses tenderness or scarring.  Meatus Normal size without lesions or prolapse.  Cervix: Normal appearance.  No lesions.  Anus: Normal exam.  No lesions.  Perineum: Normal exam.  No lesions.        Bimanual   Adnexae: No masses.  Non-tender to palpation.  Uterus: Enlarged. FHTs 155  Non-tender.  Mobile.  AV.  Adnexae: No masses.  Non-tender to palpation.  Cul-de-sac: Negative for abnormality.  Adnexae: No masses.  Non-tender to palpation.   Exam limited by patient body habitus    Assessment:    G1P1001 Patient Active Problem List   Diagnosis Date Noted  . Postoperative wound infection   . Sepsis ([place]) 02/03/2017  . Labor and delivery indication for care or intervention 01/28/2017  . Pregnant 12/24/2016  . Positive urine drug screen 07/09/2016  . Morbid obesity with BMI of 40.0-44.9, adult ([place]) 07/09/2016  . High risk teen pregnancy 07/09/2016  . Asthma 09/27/2012     1. Amenorrhea   2. Supervision of high risk pregnancy in second trimester   3. Morbid obesity with BMI of 45.0-49.9, adult ([place])   4. Previous cesarean delivery affecting pregnancy     Patient 13 weeks estimated gestational age   Plan:            Prenatal Plan 1.  The patient was given prenatal literature. 2.  She was continued on prenatal vitamins. 3.  A prenatal lab panel to be drawn at nurse visit. 4.  An appointment was requested with anesthesia for increased BMI 5.  General overview of pregnancy care at [facility]  discussed. 6.  Genetic testing and testing for other inheritable conditions discussed in detail.  She desires genetic testing  7.  A general overview of pregnancy testing, visit schedule, ultrasound schedule, and prenatal care was discussed. 8.  COVID and its risks associated with pregnancy, prevention by limiting exposure and use of masks, as well as the risks and benefits of vaccination during pregnancy were discussed in detail.  [facility] policy regarding office and hospital visitation and testing was explained. 9.  Benefits of breast-feeding discussed in detail including both maternal and infant benefits. Ready Set Baby website discussed. 10. VBAC vs repeat CD briefly discussed.   Orders No orders of the defined types were placed in this encounter.   No orders of the defined types were placed in this encounter.     F/U  Return in about 4 weeks (around 02/01/2020) for [facility]. I spent 32 minutes involved in the care of this patient preparing to see the patient by obtaining and reviewing her medical history (including labs, imaging tests and prior procedures), documenting clinical information in the electronic health record (EHR), counseling and coordinating care plans, writing and sending prescriptions, ordering tests or procedures and directly communicating with the patient by discussing pertinent items from her history and physical exam as well as detailing my assessment and plan as noted  above so that she has an informed understanding.  All of her questions were answered.  [name], M.D. 01/04/2020 9:52 AM

## 2020-01-05 LAB — URINALYSIS, ROUTINE W REFLEX MICROSCOPIC
Bilirubin, UA: NEGATIVE
Glucose, UA: NEGATIVE
Ketones, UA: NEGATIVE
Leukocytes,UA: NEGATIVE
Nitrite, UA: NEGATIVE
Protein,UA: NEGATIVE
RBC, UA: NEGATIVE
Specific Gravity, UA: 1.021 (ref 1.005–1.030)
Urobilinogen, Ur: 1 mg/dL (ref 0.2–1.0)
pH, UA: 7 (ref 5.0–7.5)

## 2020-01-07 LAB — URINE CULTURE, OB REFLEX: Organism ID, Bacteria: NO GROWTH

## 2020-01-07 LAB — CULTURE, OB URINE

## 2020-01-08 LAB — GC/CHLAMYDIA PROBE AMP
Chlamydia trachomatis, NAA: POSITIVE — AB
Neisseria Gonorrhoeae by PCR: NEGATIVE

## 2020-01-09 ENCOUNTER — Other Ambulatory Visit: Payer: Self-pay | Admitting: Surgical

## 2020-01-09 MED ORDER — AZITHROMYCIN 500 MG PO TABS: 1000.0000 mg | ORAL_TABLET | Freq: Once | ORAL | 0 refills | Status: AC

## 2020-01-10 LAB — MATERNIT21  PLUS CORE+ESS+SCA, BLOOD

## 2020-01-10 LAB — CBC WITH DIFFERENTIAL/PLATELET
Basophils Absolute: 0 10*3/uL (ref 0.0–0.2)
Basos: 0 %
EOS (ABSOLUTE): 0.1 10*3/uL (ref 0.0–0.4)
Eos: 2 %
Hematocrit: 34.3 % (ref 34.0–46.6)
Hemoglobin: 11 g/dL — ABNORMAL LOW (ref 11.1–15.9)
Immature Grans (Abs): 0 10*3/uL (ref 0.0–0.1)
Immature Granulocytes: 0 %
Lymphocytes Absolute: 1.5 10*3/uL (ref 0.7–3.1)
Lymphs: 25 %
MCH: 25.9 pg — ABNORMAL LOW (ref 26.6–33.0)
MCHC: 32.1 g/dL (ref 31.5–35.7)
MCV: 81 fL (ref 79–97)
Monocytes Absolute: 0.4 10*3/uL (ref 0.1–0.9)
Monocytes: 7 %
Neutrophils Absolute: 3.7 10*3/uL (ref 1.4–7.0)
Neutrophils: 66 %
Platelets: 224 10*3/uL (ref 150–450)
RBC: 4.24 x10E6/uL (ref 3.77–5.28)
RDW: 15.8 % — ABNORMAL HIGH (ref 11.7–15.4)
WBC: 5.8 10*3/uL (ref 3.4–10.8)

## 2020-01-10 LAB — RUBELLA SCREEN: Rubella Antibodies, IGG: 5.69 index (ref >0.99)

## 2020-01-10 LAB — ABO AND RH: Rh Factor: POSITIVE

## 2020-01-10 LAB — HEMOGLOBIN A1C
Est. average glucose Bld gHb Est-mCnc: 117 mg/dL
Hgb A1c MFr Bld: 5.7 % — ABNORMAL HIGH (ref 4.8–5.6)

## 2020-01-10 LAB — VARICELLA ZOSTER ANTIBODY, IGG: Varicella zoster IgG: <135 index — ABNORMAL LOW (ref >165)

## 2020-01-10 LAB — RPR: RPR Ser Ql: NONREACTIVE

## 2020-01-10 LAB — TSH: TSH: 0.304 u[IU]/mL — ABNORMAL LOW (ref 0.450–4.500)

## 2020-01-10 LAB — HGB SOLU + RFLX FRAC: Sickle Solubility Test - HGBRFX: NEGATIVE

## 2020-01-10 LAB — HEPATITIS B SURFACE ANTIGEN: Hepatitis B Surface Ag: NEGATIVE

## 2020-01-10 LAB — HIV ANTIBODY (ROUTINE TESTING W REFLEX): HIV Screen 4th Generation wRfx: NONREACTIVE

## 2020-01-13 HISTORY — DX: Maternal care for unspecified type scar from previous cesarean delivery: O34.219

## 2020-01-18 ENCOUNTER — Telehealth: Payer: Self-pay

## 2020-01-18 ENCOUNTER — Encounter: Payer: Medicaid Other | Admitting: Obstetrics and Gynecology
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
## 2020-01-24 LAB — MONITOR DRUG PROFILE 14(MW)
Amphetamine Scrn, Ur: NEGATIVE ng/mL
BARBITURATE SCREEN URINE: NEGATIVE ng/mL
BENZODIAZEPINE SCREEN, URINE: NEGATIVE ng/mL
Buprenorphine, Urine: NEGATIVE ng/mL
Cocaine (Metab) Scrn, Ur: NEGATIVE ng/mL
Creatinine(Crt), U: 117.5 mg/dL (ref 20.0–300.0)
Fentanyl, Urine: NEGATIVE pg/mL
Meperidine Screen, Urine: NEGATIVE ng/mL
Methadone Screen, Urine: NEGATIVE ng/mL
OXYCODONE+OXYMORPHONE UR QL SCN: NEGATIVE ng/mL
Opiate Scrn, Ur: NEGATIVE ng/mL
Ph of Urine: 7 (ref 4.5–8.9)
Phencyclidine Qn, Ur: NEGATIVE ng/mL
Propoxyphene Scrn, Ur: NEGATIVE ng/mL
SPECIFIC GRAVITY: 1.024
Tramadol Screen, Urine: NEGATIVE ng/mL

## 2020-01-24 LAB — NICOTINE SCREEN, URINE: Cotinine Ql Scrn, Ur: NEGATIVE ng/mL

## 2020-01-24 LAB — CANNABINOID (GC/MS), URINE
Cannabinoid: POSITIVE — AB
Carboxy THC (GC/MS): 628 ng/mL

## 2020-02-01 ENCOUNTER — Encounter: Payer: Self-pay | Admitting: Obstetrics and Gynecology

## 2020-02-01 ENCOUNTER — Other Ambulatory Visit: Payer: Self-pay

## 2020-02-01 ENCOUNTER — Ambulatory Visit (INDEPENDENT_AMBULATORY_CARE_PROVIDER_SITE_OTHER): Payer: Self-pay | Admitting: Obstetrics and Gynecology

## 2020-02-01 VITALS — BP 118/67 | HR 73 | Wt 330.2 lb

## 2020-02-01 DIAGNOSIS — O09299 Supervision of pregnancy with other poor reproductive or obstetric history, unspecified trimester: Secondary | ICD-10-CM

## 2020-02-01 DIAGNOSIS — Z6841 Body Mass Index (BMI) 40.0 and over, adult: Secondary | ICD-10-CM

## 2020-02-01 DIAGNOSIS — O0992 Supervision of high risk pregnancy, unspecified, second trimester: Secondary | ICD-10-CM

## 2020-02-01 DIAGNOSIS — O34219 Maternal care for unspecified type scar from previous cesarean delivery: Secondary | ICD-10-CM

## 2020-02-01 DIAGNOSIS — Z98891 History of uterine scar from previous surgery: Secondary | ICD-10-CM

## 2020-02-01 DIAGNOSIS — Z3A15 15 weeks gestation of pregnancy: Secondary | ICD-10-CM

## 2020-02-01 LAB — POCT URINALYSIS DIPSTICK OB
Bilirubin, UA: NEGATIVE
Blood, UA: NEGATIVE
Glucose, UA: NEGATIVE
Ketones, UA: NEGATIVE
Leukocytes, UA: NEGATIVE
Nitrite, UA: NEGATIVE
POC,PROTEIN,UA: NEGATIVE
Spec Grav, UA: 1.025 (ref 1.010–1.025)
Urobilinogen, UA: 0.2 E.U./dL
pH, UA: 6.5 (ref 5.0–8.0)

## 2020-02-01 NOTE — Progress Notes (Signed)
Patient doing well, but does note some cramping.  States she is under some stress right now due to her mother being hospitalized (has heart and kidney failure, very sick right now).  Discussed genetic screening with patient, ok to perform.  MaterniT21 normal, female fetus. Had normal early glucola. Needs referral to Anesthesia, will order. For anatomy scan next visit. H/o C-section in last pregnancy, with fetal macrosomia (> 9 lbs), notes she would like to [facility] unless baby is too large again. Discussed that we could assess fetal growth towards the end of her pregnancy and could make a more definitive decision at that time. RTC in 4-5 weeks.

## 2020-02-01 NOTE — Progress Notes (Signed)
ROB-Pt present for routine prenatal care. Pt stated that she was unsure if she has felt the baby move this week and having some cramping like a cycle no vaginal bleeding.

## 2020-02-01 NOTE — Patient Instructions (Signed)

## 2020-03-06 ENCOUNTER — Other Ambulatory Visit: Payer: Self-pay | Admitting: Obstetrics and Gynecology

## 2020-03-06 DIAGNOSIS — Z3492 Encounter for supervision of normal pregnancy, unspecified, second trimester: Secondary | ICD-10-CM

## 2020-03-07 ENCOUNTER — Ambulatory Visit (INDEPENDENT_AMBULATORY_CARE_PROVIDER_SITE_OTHER): Payer: Medicaid Other | Admitting: Obstetrics and Gynecology

## 2020-03-07 ENCOUNTER — Other Ambulatory Visit: Payer: Self-pay

## 2020-03-07 ENCOUNTER — Encounter: Payer: Self-pay | Admitting: Obstetrics and Gynecology

## 2020-03-07 VITALS — BP 105/60 | HR 80 | Ht 69.0 in | Wt 340.8 lb

## 2020-03-07 DIAGNOSIS — Z3A20 20 weeks gestation of pregnancy: Secondary | ICD-10-CM

## 2020-03-07 DIAGNOSIS — Z3482 Encounter for supervision of other normal pregnancy, second trimester: Secondary | ICD-10-CM

## 2020-03-07 LAB — POCT URINALYSIS DIPSTICK OB
Bilirubin, UA: NEGATIVE
Blood, UA: NEGATIVE
Glucose, UA: NEGATIVE
Ketones, UA: NEGATIVE
Leukocytes, UA: NEGATIVE
Nitrite, UA: NEGATIVE
POC,PROTEIN,UA: NEGATIVE
Spec Grav, UA: 1.01
Urobilinogen, UA: 0.2 U/dL
pH, UA: 7.5

## 2020-03-07 NOTE — Progress Notes (Signed)
OB-Pt present for routine prenatal care. Pt stated having lower abd pain and noticing breast milk coming from the breast.

## 2020-03-07 NOTE — Progress Notes (Signed)
ROB: She states she is generally doing well.  Reports fetal movement.  Did not go to her anesthesia appointment.  I have rescheduled this and encouraged her to attend.  FAS rescheduled.  Patient does have some occasional small amounts of breast discharge.  We have discussed this in detail as normal during pregnancy.  I have encouraged her not to continue checking and to wear more form fitting supportive bras that may decrease this discharge.  She understands this can be normal during pregnancy.

## 2020-03-12 ENCOUNTER — Observation Stay
Admission: EM | Admit: 2020-03-12 | Discharge: 2020-03-12 | Disposition: A | Discharge status: Home / Self Care | Payer: Medicaid Other | Attending: Obstetrics and Gynecology | Admitting: Obstetrics and Gynecology

## 2020-03-12 ENCOUNTER — Encounter: Payer: Self-pay | Admitting: Obstetrics and Gynecology

## 2020-03-12 ENCOUNTER — Other Ambulatory Visit: Payer: Self-pay

## 2020-03-12 DIAGNOSIS — O26892 Other specified pregnancy related conditions, second trimester: Principal | ICD-10-CM | POA: Insufficient documentation

## 2020-03-12 DIAGNOSIS — Z20822 Contact with and (suspected) exposure to covid-19: Secondary | ICD-10-CM | POA: Diagnosis not present

## 2020-03-12 DIAGNOSIS — O368120 Decreased fetal movements, second trimester, not applicable or unspecified: Secondary | ICD-10-CM | POA: Diagnosis not present

## 2020-03-12 DIAGNOSIS — R112 Nausea with vomiting, unspecified: Secondary | ICD-10-CM

## 2020-03-12 DIAGNOSIS — O219 Vomiting of pregnancy, unspecified: Secondary | ICD-10-CM | POA: Diagnosis not present

## 2020-03-12 DIAGNOSIS — F1290 Cannabis use, unspecified, uncomplicated: Secondary | ICD-10-CM | POA: Diagnosis not present

## 2020-03-12 DIAGNOSIS — O99322 Drug use complicating pregnancy, second trimester: Secondary | ICD-10-CM | POA: Insufficient documentation

## 2020-03-12 DIAGNOSIS — Z3A20 20 weeks gestation of pregnancy: Secondary | ICD-10-CM | POA: Insufficient documentation

## 2020-03-12 LAB — URINE DRUG SCREEN, QUALITATIVE (ARMC ONLY)
Amphetamines, Ur Screen: NOT DETECTED
Barbiturates, Ur Screen: NOT DETECTED
Benzodiazepine, Ur Scrn: NOT DETECTED
Cannabinoid 50 Ng, Ur ~~LOC~~: POSITIVE — AB
Cocaine Metabolite,Ur ~~LOC~~: NOT DETECTED
MDMA (Ecstasy)Ur Screen: NOT DETECTED
Methadone Scn, Ur: NOT DETECTED
Opiate, Ur Screen: NOT DETECTED
Phencyclidine (PCP) Ur S: NOT DETECTED
Tricyclic, Ur Screen: NOT DETECTED

## 2020-03-12 LAB — URINALYSIS, COMPLETE (UACMP) WITH MICROSCOPIC
Bacteria, UA: NONE SEEN
Bilirubin Urine: NEGATIVE
Glucose, UA: NEGATIVE mg/dL
Hgb urine dipstick: NEGATIVE
Ketones, ur: NEGATIVE mg/dL
Leukocytes,Ua: NEGATIVE
Nitrite: NEGATIVE
Protein, ur: 30 mg/dL — AB
Specific Gravity, Urine: 1.027 (ref 1.005–1.030)
pH: 6 (ref 5.0–8.0)

## 2020-03-12 LAB — RESP PANEL BY RT-PCR (FLU A&B, COVID) ARPGX2
Influenza A by PCR: NEGATIVE
Influenza B by PCR: NEGATIVE
SARS Coronavirus 2 by RT PCR: NEGATIVE

## 2020-03-12 MED ORDER — OXYTOCIN-SODIUM CHLORIDE 30-0.9 UT/500ML-% IV SOLN: 2.5000 [IU]/h | INTRAVENOUS | Status: DC

## 2020-03-12 MED ORDER — SODIUM CHLORIDE 0.9 % IV SOLN
8.0000 mg | Freq: Once | INTRAVENOUS | Status: AC
  Administered 2020-03-12: 8 mg via INTRAVENOUS
  Filled 2020-03-12: qty 4

## 2020-03-12 MED ORDER — LACTATED RINGERS IV SOLN: 500.0000 mL | INTRAVENOUS | Status: DC | PRN

## 2020-03-12 MED ORDER — LACTATED RINGERS IV SOLN: INTRAVENOUS | Status: DC

## 2020-03-12 MED ORDER — SODIUM CHLORIDE 0.45 % IV SOLN: INTRAVENOUS | Status: DC

## 2020-03-12 MED ORDER — OXYTOCIN BOLUS FROM INFUSION: 333.0000 mL | Freq: Once | INTRAVENOUS | Status: DC

## 2020-03-12 NOTE — Discharge Summary (Signed)
    L&D OB Triage Note  SUBJECTIVE Joanna [name] is a 20 y.o. G31P1001 female at [redacted]w[redacted]d, [place] Estimated Date of Delivery: 07/24/20 who presented to triage with complaints of nausea and vomiting since yesterday and generally not feeling well.  Also reports a decrease in fetal movement.  She denies vaginal bleeding or discharge.   OB History  Gravida Para Term Preterm AB Living  2 1 1  0 0 1  SAB IAB Ectopic Multiple Live Births  0 0 0 0 1    # Outcome Date GA Lbr Len/2nd Weight Sex Delivery Anes PTL Lv  2 Current           1 Term 01/28/17 [redacted]w[redacted]d  4480 g F CS-LTranv Spinal, EPI  LIV     Birth Comments: female     Name: Joanna [name]     Apgar1: 8  Apgar5: 9    No medications prior to admission.     OBJECTIVE  Nursing Evaluation:   BP 97/67   Pulse 75   Temp 97.8 F (36.6 C) (Oral)   Resp 18   LMP 09/30/2019 (Approximate)    Findings:        Urinalysis normal     UDS-positive for MJ     Covid negative     After receiving IV fluids and Zofran patient felt much better and desired discharge.      NST was performed and has been reviewed by me.  NST INTERPRETATION: Normal fetal heart tones noted for 20-week fetus.  No contractions noted on toco  Mode: Doppler Baseline Rate (A): 143 bpm           Contraction Frequency (min): none  ASSESSMENT Impression:  1.  Pregnancy:  G2P1001 at [redacted]w[redacted]d , EDD Estimated Date of Delivery: 07/24/20 2.  Reassuring fetal and maternal status 3.  Patient with flulike symptoms  PLAN 1. Discussed current condition and above findings with patient and reassurance given.  All questions answered. 2. Discharge home with standard labor precautions given to return to L&D or call the office for problems. 3. Continue routine prenatal care.

## 2020-03-12 NOTE — Discharge Instructions (Signed)
Call provider or return to birthplace with: ? ?1. Regular contractions ?2. Leaking of fluid from your vagina ?3. Vaginal bleeding: Bright red or heavy like a period ?4. Decreased Fetal movement  ?

## 2020-03-12 NOTE — OB Triage Note (Signed)
Pt presents to L&D with c/o cramping for 2 days, decreased fm, pt denies LOF and Bleeding. Pt also reports Nausea since 1400 yesterday. Reports 5 episodes of vomiting since then. Doppler fhr 145 in lower right abd.

## 2020-03-14 ENCOUNTER — Other Ambulatory Visit: Payer: Medicaid Other

## 2020-03-15 ENCOUNTER — Ambulatory Visit: Payer: Medicaid Other

## 2020-03-18 ENCOUNTER — Other Ambulatory Visit: Payer: Self-pay

## 2020-03-18 ENCOUNTER — Emergency Department
Admission: EM | Admit: 2020-03-18 | Discharge: 2020-03-18 | Disposition: A | Discharge status: Home / Self Care | Payer: Medicaid Other | Attending: Emergency Medicine | Admitting: Emergency Medicine

## 2020-03-18 DIAGNOSIS — J069 Acute upper respiratory infection, unspecified: Secondary | ICD-10-CM | POA: Diagnosis not present

## 2020-03-18 DIAGNOSIS — R059 Cough, unspecified: Secondary | ICD-10-CM

## 2020-03-18 DIAGNOSIS — J45909 Unspecified asthma, uncomplicated: Secondary | ICD-10-CM | POA: Diagnosis not present

## 2020-03-18 DIAGNOSIS — J029 Acute pharyngitis, unspecified: Secondary | ICD-10-CM | POA: Insufficient documentation

## 2020-03-18 DIAGNOSIS — Z20822 Contact with and (suspected) exposure to covid-19: Secondary | ICD-10-CM | POA: Insufficient documentation

## 2020-03-18 MED ORDER — CEPHALEXIN 500 MG PO CAPS: 500.0000 mg | ORAL_CAPSULE | Freq: Two times a day (BID) | ORAL | 0 refills | Status: AC

## 2020-03-18 MED ORDER — CEPHALEXIN 500 MG PO CAPS
500.0000 mg | ORAL_CAPSULE | Freq: Once | ORAL | Status: AC
  Administered 2020-03-18: 500 mg via ORAL
  Filled 2020-03-18: qty 1

## 2020-03-18 NOTE — ED Triage Notes (Signed)
Pt states she has had cough, sore throat and fever for about a week. she was tested for covid on the 4th and tested negative. Symptoms have persisted

## 2020-03-18 NOTE — ED Provider Notes (Signed)
[facility] Emergency Department Provider Note   ____________________________________________   Event Date/Time   First MD Initiated Contact with Patient 03/18/20 2229     (approximate)  I have reviewed the triage vital signs and the nursing notes.   HISTORY  Chief Complaint Sore Throat and Cough    HPI Joanna [name] is a 20 y.o. female with stated past medical history of bipolar disorder who presents for cough, sore throat, fever, and rhinorrhea for the last 10 days.  Patient denies any worsening or improvement of symptoms over this time.  Patient states that she was already tested for Covid and was found to be negative.  Patient denies any new symptoms over this time.  Patient currently denies any vision changes, tinnitus, difficulty speaking, facial droop, chest pain, shortness of breath, abdominal pain, nausea/vomiting/diarrhea, dysuria, or weakness/numbness/paresthesias in any extremity         Past Medical History:  Diagnosis Date  . Anxiety   . Asthma   . Bipolar 1 disorder ([place])   . Thrombophlebitis 02/2017   septic pelvic venous thrombophlebitis    Patient Active Problem List   Diagnosis Date Noted  . Postoperative wound infection   . Sepsis ([place]) 02/03/2017  . Labor and delivery indication for care or intervention 01/28/2017  . Pregnant 12/24/2016  . Positive urine drug screen 07/09/2016  . Morbid obesity with BMI of 40.0-44.9, adult ([place]) 07/09/2016  . High risk teen pregnancy 07/09/2016  . Asthma 09/27/2012    Past Surgical History:  Procedure Laterality Date  . CESAREAN SECTION N/A 01/28/2017   Procedure: CESAREAN SECTION;  Surgeon: Harlin Heys, MD;  Location: [facility] ORS;  Service: Obstetrics;  Laterality: N/A;  . CYSTOSCOPY W/ RETROGRADES Right 03/26/2017   Procedure: CYSTOSCOPY WITH RETROGRADE PYELOGRAM;  Surgeon: Abbie Sons, MD;  Location: [facility] ORS;  Service: Urology;  Laterality: Right;  . CYSTOSCOPY W/  URETERAL STENT PLACEMENT Right 02/06/2017   Procedure: CYSTOSCOPY WITH RETROGRADE PYELOGRAM/URETERAL STENT PLACEMENT;  Surgeon: Abbie Sons, MD;  Location: [facility] ORS;  Service: Urology;  Laterality: Right;  . CYSTOSCOPY W/ URETERAL STENT REMOVAL Right 03/26/2017   Procedure: CYSTOSCOPY WITH STENT REMOVAL;  Surgeon: Abbie Sons, MD;  Location: [facility] ORS;  Service: Urology;  Laterality: Right;  . TONSILLECTOMY      Prior to Admission medications   Medication Sig Start Date End Date Taking? Authorizing Provider  cephALEXin (KEFLEX) 500 MG capsule Take 1 capsule (500 mg total) by mouth 2 (two) times daily for 7 days. 03/18/20 03/25/20 Yes Glenice Ciccone, Vista Lawman, MD  Prenatal Vit-Fe Fumarate-FA (PRENATAL VITAMIN PO) Take by mouth.    [provider]    Allergies Tylenol [acetaminophen]  Family History  Problem Relation Age of Onset  . Diabetes Mother   . Hypertension Mother   . Cancer Maternal Aunt   . Stroke Maternal Grandmother   . Thyroid disease Maternal Grandmother   . Breast cancer Paternal Grandmother   . Hyperlipidemia Maternal Grandfather   . Diabetes Maternal Grandfather   . Chronic Renal Failure Maternal Grandfather   . Heart Problems Maternal Grandfather        pacemaker   . Hypertension Maternal Grandfather   . Diabetes Paternal Grandfather   . Heart Problems Brother   . Asthma Brother     Social History Social History   Tobacco Use  . Smoking status: Never Smoker  . Smokeless tobacco: Never Used  Vaping Use  . Vaping Use: Never used  Substance  Use Topics  . Alcohol use: No  . Drug use: No    Review of Systems Constitutional: Endorses fever/chills Eyes: No visual changes. ENT: Endorses sore throat. Cardiovascular: Denies chest pain. Respiratory: Denies shortness of breath. Gastrointestinal: No abdominal pain.  No nausea, no vomiting.  No diarrhea. Genitourinary: Negative for dysuria. Musculoskeletal: Negative for acute arthralgias Skin: Negative  for rash. Neurological: Negative for headaches, weakness/numbness/paresthesias in any extremity Psychiatric: Negative for suicidal ideation/homicidal ideation   ____________________________________________   PHYSICAL EXAM:  VITAL SIGNS: ED Triage Vitals  Enc Vitals Group     BP 03/18/20 2053 (!) 121/49     Pulse Rate 03/18/20 2053 88     Resp 03/18/20 2053 20     Temp 03/18/20 2053 98.7 F (37.1 C)     Temp Source 03/18/20 2053 Oral     SpO2 03/18/20 2053 98 %     Weight 03/18/20 2051 (!) 342 lb (155.1 kg)     Height 03/18/20 2051 5\' 9"  (1.753 m)     Head Circumference --      Peak Flow --      Pain Score --      Pain Loc --      Pain Edu? --      Excl. in [place]? --    Constitutional: Alert and oriented. Well appearing and in no acute distress. Eyes: Conjunctivae are normal. PERRL. Head: Atraumatic. Nose: No congestion/rhinnorhea. Mouth/Throat: Mucous membranes are moist.  Erythematous posterior oropharynx Neck: No stridor Cardiovascular: Grossly normal heart sounds.  Good peripheral circulation. Respiratory: Normal respiratory effort.  No retractions. Gastrointestinal: Soft and nontender. No distention. Musculoskeletal: No obvious deformities Neurologic:  Normal speech and language. No gross focal neurologic deficits are appreciated. Skin:  Skin is warm and dry. No rash noted. Psychiatric: Mood and affect are normal. Speech and behavior are normal.  ____________________________________________   LABS (all labs ordered are listed, but only abnormal results are displayed)  Labs Reviewed - No data to display   PROCEDURES  Procedure(s) performed (including Critical Care):  Procedures   ____________________________________________   INITIAL IMPRESSION / ASSESSMENT AND PLAN / ED COURSE  As part of my medical decision making, I reviewed the following data within the [place] notes reviewed and incorporated, Old chart reviewed, and Notes  from prior ED visits reviewed and incorporated        Otherwise healthy patient presenting with constellation of symptoms likely representing uncomplicated viral upper respiratory symptoms as characterized by mild pharyngitis  Unlikely PTA/RPA: no hot potato voice, no uvular deviation, Unlikely Esophageal rupture: No history of dysphagia Unlikely deep space infection/Ludwigs Low suspicion for CNS infection bacterial sinusitis, or pneumonia given exam and history.  Unlikely Strep or EBV as centor negative and with no pharyngeal exudate, posterior LAD, or splenomegaly.  Will attempt to alleviate symptoms conservatively; will treat empirically with antibiotics given length of symptoms. No respiratory distress, otherwise relatively well appearing and nontoxic. Will discuss prompt follow up with PMD and strict return precautions.      ____________________________________________   FINAL CLINICAL IMPRESSION(S) / ED DIAGNOSES  Final diagnoses:  Cough  Upper respiratory tract infection, unspecified type  Sore throat     ED Discharge Orders         Ordered    cephALEXin (KEFLEX) 500 MG capsule  2 times daily        03/18/20 2237           Note:  This document was prepared using  [employer] forces training and education officer and may include unintentional dictation errors.   Naaman Plummer, MD 03/18/20 [phone]

## 2020-03-25 ENCOUNTER — Other Ambulatory Visit: Payer: Self-pay

## 2020-03-25 ENCOUNTER — Ambulatory Visit
Admission: RE | Admit: 2020-03-25 | Discharge: 2020-03-25 | Disposition: A | Discharge status: Home / Self Care | Payer: Medicaid Other | Source: Ambulatory Visit | Attending: Obstetrics and Gynecology | Admitting: Obstetrics and Gynecology

## 2020-03-25 DIAGNOSIS — Z3492 Encounter for supervision of normal pregnancy, unspecified, second trimester: Secondary | ICD-10-CM | POA: Insufficient documentation

## 2020-03-27 ENCOUNTER — Encounter: Payer: Self-pay | Admitting: Obstetrics and Gynecology

## 2020-03-27 ENCOUNTER — Other Ambulatory Visit (HOSPITAL_COMMUNITY)
Admission: RE | Admit: 2020-03-27 | Discharge: 2020-03-27 | Disposition: A | Discharge status: Home / Self Care | Payer: Medicaid Other | Source: Ambulatory Visit | Attending: Obstetrics and Gynecology | Admitting: Obstetrics and Gynecology

## 2020-03-27 ENCOUNTER — Other Ambulatory Visit: Payer: Self-pay

## 2020-03-27 ENCOUNTER — Ambulatory Visit (INDEPENDENT_AMBULATORY_CARE_PROVIDER_SITE_OTHER): Payer: Medicaid Other | Admitting: Obstetrics and Gynecology

## 2020-03-27 VITALS — BP 139/62 | HR 83 | Wt 331.1 lb

## 2020-03-27 DIAGNOSIS — B373 Candidiasis of vulva and vagina: Secondary | ICD-10-CM | POA: Diagnosis not present

## 2020-03-27 DIAGNOSIS — Z3482 Encounter for supervision of other normal pregnancy, second trimester: Secondary | ICD-10-CM | POA: Diagnosis not present

## 2020-03-27 DIAGNOSIS — Z3A23 23 weeks gestation of pregnancy: Secondary | ICD-10-CM | POA: Diagnosis not present

## 2020-03-27 DIAGNOSIS — N898 Other specified noninflammatory disorders of vagina: Secondary | ICD-10-CM | POA: Insufficient documentation

## 2020-03-27 DIAGNOSIS — B3731 Acute candidiasis of vulva and vagina: Secondary | ICD-10-CM

## 2020-03-27 DIAGNOSIS — O98512 Other viral diseases complicating pregnancy, second trimester: Secondary | ICD-10-CM | POA: Diagnosis not present

## 2020-03-27 NOTE — Addendum Note (Signed)
Addended by: Durwin Glaze on: 03/27/2020 03:00 PM   Modules accepted: Orders

## 2020-03-27 NOTE — Progress Notes (Signed)
HPI:      [name] is a 20 y.o. G2P1001 who LMP was Patient's last menstrual period was 09/30/2019 (approximate).  Subjective:   She presents today with complaint of new onset vaginal itching and thick white discharge.  She believes she may have a yeast infection. A significant note she is approximately 23 weeks estimated gestational age. Patient had trichomonas early in pregnancy and was treated. Reports daily fetal movement. Missed her anesthesia appointment for elevated BMI.    Hx: The following portions of the patient's history were reviewed and updated as appropriate:             She  has a past medical history of Anxiety, Asthma, Bipolar 1 disorder ([place]), and Thrombophlebitis (02/2017). She does not have any pertinent problems on file. She  has a past surgical history that includes Tonsillectomy; Cesarean section (N/A, 01/28/2017); Cystoscopy w/ ureteral stent placement (Right, 02/06/2017); Cystoscopy w/ ureteral stent removal (Right, 03/26/2017); and Cystoscopy w/ retrogrades (Right, 03/26/2017). Her family history includes Asthma in her brother; Breast cancer in her paternal grandmother; Cancer in her maternal aunt; Chronic Renal Failure in her maternal grandfather; Diabetes in her maternal grandfather, mother, and paternal grandfather; Heart Problems in her brother and maternal grandfather; Hyperlipidemia in her maternal grandfather; Hypertension in her maternal grandfather and mother; Stroke in her maternal grandmother; Thyroid disease in her maternal grandmother. She  reports that she has never smoked. She has never used smokeless tobacco. She reports that she does not drink alcohol and does not use drugs. She has a current medication list which includes the following prescription(s): prenatal vit-fe fumarate-fa. She is allergic to tylenol [acetaminophen].       Review of Systems:  Review of Systems  Constitutional: Denied constitutional symptoms, night sweats, recent  illness, fatigue, fever, insomnia and weight loss.  Eyes: Denied eye symptoms, eye pain, photophobia, vision change and visual disturbance.  Ears/Nose/Throat/Neck: Denied ear, nose, throat or neck symptoms, hearing loss, nasal discharge, sinus congestion and sore throat.  Cardiovascular: Denied cardiovascular symptoms, arrhythmia, chest pain/pressure, edema, exercise intolerance, orthopnea and palpitations.  Respiratory: Denied pulmonary symptoms, asthma, pleuritic pain, productive sputum, cough, dyspnea and wheezing.  Gastrointestinal: Denied, gastro-esophageal reflux, melena, nausea and vomiting.  Genitourinary: See HPI for additional information.  Musculoskeletal: Denied musculoskeletal symptoms, stiffness, swelling, muscle weakness and myalgia.  Dermatologic: Denied dermatology symptoms, rash and scar.  Neurologic: Denied neurology symptoms, dizziness, headache, neck pain and syncope.  Psychiatric: Denied psychiatric symptoms, anxiety and depression.  Endocrine: Denied endocrine symptoms including hot flashes and night sweats.   Meds:   Current Outpatient Medications on File Prior to Visit  Medication Sig Dispense Refill  . Prenatal Vit-Fe Fumarate-FA (PRENATAL VITAMIN PO) Take by mouth.     No current facility-administered medications on file prior to visit.          Objective:     Vitals:   03/27/20 1417  BP: 139/62  Pulse: 83   Filed Weights   03/27/20 1417  Weight: (!) 331 lb 1.6 oz (150.2 kg)              Physical examination   Pelvic:  Vulva: Normal appearance.  No lesions.  Erythematous  Vagina: No lesions or abnormalities noted.  Thick white vaginal discharge noted  Support: Normal pelvic support.  Urethra No masses tenderness or scarring.  Meatus Normal size without lesions or prolapse.  Cervix: Normal appearance.  No lesions.  Anus: Normal exam.  No lesions.  Perineum: Normal exam.  No lesions.     Assessment:    G2P1001 Patient Active Problem List    Diagnosis Date Noted  . Postoperative wound infection   . Sepsis ([place]) 02/03/2017  . Labor and delivery indication for care or intervention 01/28/2017  . Pregnant 12/24/2016  . Positive urine drug screen 07/09/2016  . Morbid obesity with BMI of 40.0-44.9, adult ([place]) 07/09/2016  . High risk teen pregnancy 07/09/2016  . Asthma 09/27/2012     1. Monilial vulvovaginitis   2. Vaginal discharge   3. Vaginal itching   4. Encounter for supervision of other normal pregnancy in second trimester     Based on symptoms and appearance likely to be monilia.  Possible itching caused by a recurrence of trichomonas but unlikely.   Plan:            1.  Advised use of Monistat during pregnancy.  Discussed with patient.  2.  Nuswab performed.  3.  Follow-up with Dr. Marcelline Mates in 9 days as scheduled  4.  Anesthesia appointment for elevated BMI rescheduled  5.  Completion of FAS ultrasound scheduled Orders Orders Placed This Encounter  Procedures  . US OB Follow Up    No orders of the defined types were placed in this encounter.     F/U  No follow-ups on file. I spent 22 minutes involved in the care of this patient preparing to see the patient by obtaining and reviewing her medical history (including labs, imaging tests and prior procedures), documenting clinical information in the electronic health record (EHR), counseling and coordinating care plans, writing and sending prescriptions, ordering tests or procedures and directly communicating with the patient by discussing pertinent items from her history and physical exam as well as detailing my assessment and plan as noted above so that she has an informed understanding.  All of her questions were answered.  [name], M.D. 03/27/2020 2:35 PM

## 2020-03-29 ENCOUNTER — Ambulatory Visit: Payer: Medicaid Other

## 2020-03-29 LAB — CERVICOVAGINAL ANCILLARY ONLY
Bacterial Vaginitis (gardnerella): NEGATIVE
Candida Glabrata: NEGATIVE
Candida Vaginitis: POSITIVE — AB
Chlamydia: NEGATIVE
Comment: NEGATIVE
Comment: NEGATIVE
Comment: NEGATIVE
Comment: NEGATIVE
Comment: NEGATIVE
Comment: NORMAL
Neisseria Gonorrhea: NEGATIVE
Trichomonas: NEGATIVE

## 2020-04-05 ENCOUNTER — Ambulatory Visit (INDEPENDENT_AMBULATORY_CARE_PROVIDER_SITE_OTHER): Payer: Medicaid Other | Admitting: Obstetrics and Gynecology

## 2020-04-05 ENCOUNTER — Encounter: Payer: Self-pay | Admitting: Obstetrics and Gynecology

## 2020-04-05 ENCOUNTER — Other Ambulatory Visit: Payer: Self-pay

## 2020-04-05 VITALS — BP 103/67 | HR 76 | Wt 332.0 lb

## 2020-04-05 DIAGNOSIS — Z3482 Encounter for supervision of other normal pregnancy, second trimester: Secondary | ICD-10-CM

## 2020-04-05 DIAGNOSIS — O09299 Supervision of pregnancy with other poor reproductive or obstetric history, unspecified trimester: Secondary | ICD-10-CM

## 2020-04-05 DIAGNOSIS — Z6841 Body Mass Index (BMI) 40.0 and over, adult: Secondary | ICD-10-CM

## 2020-04-05 DIAGNOSIS — O34219 Maternal care for unspecified type scar from previous cesarean delivery: Secondary | ICD-10-CM

## 2020-04-05 DIAGNOSIS — Z98891 History of uterine scar from previous surgery: Secondary | ICD-10-CM

## 2020-04-05 DIAGNOSIS — Z3A24 24 weeks gestation of pregnancy: Secondary | ICD-10-CM

## 2020-04-05 LAB — POCT URINALYSIS DIPSTICK OB
Bilirubin, UA: NEGATIVE
Blood, UA: NEGATIVE
Glucose, UA: NEGATIVE
Ketones, UA: NEGATIVE
Leukocytes, UA: NEGATIVE
Nitrite, UA: NEGATIVE
Spec Grav, UA: 1.025 (ref 1.010–1.025)
Urobilinogen, UA: 0.2 E.U./dL
pH, UA: 6.5 (ref 5.0–8.0)

## 2020-04-05 NOTE — Progress Notes (Signed)
OB-Pt present for routine prenatal care. Pt stated that she was doing well.

## 2020-04-05 NOTE — Patient Instructions (Signed)

## 2020-04-05 NOTE — Progress Notes (Signed)
ROB- Doing well overall. Patient reports trace edema in feet and ankles. Encouraged patient to wear compression socks when working. Came in last week for yeast infection, see previous encounter note. Patient reports that yeast infection has cleared up, no symptoms. Patient given Anesthesia number and reminded of her appointment that is set for May 03, 2020 @9am . Anticipatory guidance regarding course of prenatal care. Reviewed red flag symptoms and when to call. RTC x 4 weeks for 28 week labs and ROB with Dr. Amalia Hailey.   Joanna [name], [place] 04/05/20 10:20 AM

## 2020-04-11 ENCOUNTER — Observation Stay
Admission: EM | Admit: 2020-04-11 | Discharge: 2020-04-11 | Disposition: A | Discharge status: Home / Self Care | Payer: Medicaid Other | Attending: Obstetrics and Gynecology | Admitting: Obstetrics and Gynecology

## 2020-04-11 ENCOUNTER — Other Ambulatory Visit: Payer: Self-pay

## 2020-04-11 ENCOUNTER — Encounter: Payer: Self-pay | Admitting: Obstetrics and Gynecology

## 2020-04-11 DIAGNOSIS — R519 Headache, unspecified: Secondary | ICD-10-CM | POA: Diagnosis not present

## 2020-04-11 DIAGNOSIS — Z3A25 25 weeks gestation of pregnancy: Secondary | ICD-10-CM | POA: Insufficient documentation

## 2020-04-11 DIAGNOSIS — O368120 Decreased fetal movements, second trimester, not applicable or unspecified: Secondary | ICD-10-CM

## 2020-04-11 DIAGNOSIS — Z20822 Contact with and (suspected) exposure to covid-19: Secondary | ICD-10-CM | POA: Diagnosis not present

## 2020-04-11 DIAGNOSIS — O219 Vomiting of pregnancy, unspecified: Principal | ICD-10-CM | POA: Insufficient documentation

## 2020-04-11 DIAGNOSIS — R059 Cough, unspecified: Secondary | ICD-10-CM | POA: Diagnosis not present

## 2020-04-11 LAB — RESP PANEL BY RT-PCR (FLU A&B, COVID) ARPGX2
Influenza A by PCR: NEGATIVE
Influenza B by PCR: NEGATIVE
SARS Coronavirus 2 by RT PCR: NEGATIVE

## 2020-04-11 MED ORDER — ACETAMINOPHEN 500 MG PO TABS
1000.0000 mg | ORAL_TABLET | Freq: Once | ORAL | Status: AC
  Administered 2020-04-11: 1000 mg via ORAL
  Filled 2020-04-11: qty 2

## 2020-04-11 MED ORDER — ONDANSETRON HCL 4 MG/2ML IJ SOLN
4.0000 mg | Freq: Once | INTRAMUSCULAR | Status: AC
  Administered 2020-04-11: 4 mg via INTRAVENOUS
  Filled 2020-04-11: qty 2

## 2020-04-11 MED ORDER — MENTHOL 3 MG MT LOZG
1.0000 | LOZENGE | OROMUCOSAL | Status: DC | PRN
  Administered 2020-04-11: 3 mg via ORAL
  Filled 2020-04-11: qty 9

## 2020-04-11 MED ORDER — LACTATED RINGERS IV BOLUS
1000.0000 mL | Freq: Once | INTRAVENOUS | Status: AC
  Administered 2020-04-11 (×2): 1000 mL via INTRAVENOUS

## 2020-04-11 MED ORDER — GUAIFENESIN 100 MG/5ML PO SOLN
5.0000 mL | ORAL | Status: DC | PRN
  Administered 2020-04-11: 100 mg via ORAL
  Filled 2020-04-11 (×2): qty 5

## 2020-04-11 MED ORDER — METOCLOPRAMIDE HCL 5 MG/ML IJ SOLN
10.0000 mg | Freq: Once | INTRAMUSCULAR | Status: AC
  Administered 2020-04-11: 10 mg via INTRAVENOUS
  Filled 2020-04-11: qty 2

## 2020-04-11 NOTE — Progress Notes (Signed)
Pt to be d/c home and to self care. Pt given teaching on when to seek medical attention (LOF, VB and decreased FM, etc..). Pt verbalized understanding of d/c instructions.

## 2020-04-11 NOTE — OB Triage Note (Signed)
Pt is a 19y/o G2P1 at [redacted]w[redacted]d with c/o decreased fetal movement, cough, headache, cramping, and N/V. Pt states she began to get a cough Monday and tested for negative for covid. Pt states that on Tuesday she began with N/V the headache and cramping, as well as the worsening cough. Pt denies LOF and VB. Monitors applied and assessing. Initial FHT 160.

## 2020-04-12 ENCOUNTER — Other Ambulatory Visit: Payer: Self-pay | Admitting: Obstetrics and Gynecology

## 2020-04-12 DIAGNOSIS — O368120 Decreased fetal movements, second trimester, not applicable or unspecified: Secondary | ICD-10-CM

## 2020-04-12 MED ORDER — METOCLOPRAMIDE HCL 10 MG PO TABS: 10.0000 mg | ORAL_TABLET | Freq: Four times a day (QID) | ORAL | 0 refills | Status: DC | PRN

## 2020-04-12 NOTE — Final Progress Note (Signed)
L&D OB Triage Note  HPI:  Joanna [name] is a 20 y.o. G32P1001 female at [redacted]w[redacted]d, EDD Estimated Date of Delivery: 07/24/20 who presented to triage for complaints of cough x 4 days, nausea/vomiting, headache, and pelvic cramping x 3 days, and  decreased fetal movement x 1 day.  Reports having a COVID test 4 days ago with onset of symptoms, with negative results. She denied fevers, chills, chest pain, SOB, or recent sick contacts. She was evaluated by the nurses with no acute findings.  Vital signs stable. An NST was performed and has been reviewed by MD. She was treated with IV bolus followed by maintenance fluids, IV Zofran and PO Tylenol initially. Patient failed initial PO challenge, so was then administered IV Reglan. She was given throat lozenges, and eventually a dose of Robitussin once she passed second PO challenge.     Physical Exam:  Blood pressure (!) 119/52, pulse 95, temperature 98.9 F (37.2 C), temperature source Oral, last menstrual period 09/30/2019, currently breastfeeding. Remainder of exam deferred.   NST INTERPRETATION: Indications: decreased fetal movement  Mode: External (monitors removed per provider verbal order) Baseline Rate (A): 150 bpm (fht) Variability: Moderate Accelerations: 10 x 10 Decelerations: None     Contraction Frequency (min): none (Pt coughing)  Impression: reactive   Labs:  Results for orders placed or performed during the hospital encounter of 04/11/20  Resp Panel by RT-PCR (Flu A&B, Covid) Nasopharyngeal Swab   Specimen: Nasopharyngeal Swab; Nasopharyngeal(NP) swabs in vial transport medium  Result Value Ref Range   SARS Coronavirus 2 by RT PCR NEGATIVE NEGATIVE   Influenza A by PCR NEGATIVE NEGATIVE   Influenza B by PCR NEGATIVE NEGATIVE    Plan: NST performed was reviewed and was found to be reactive. COVID testing on repeat was negative. She was discharged home once symptoms improved with interventions.  Patient desired prescription  for Reglan, given.  Continue routine prenatal care. Follow up with OB/GYN as previously scheduled.     Rubie Maid, MD  [facility]

## 2020-04-16 ENCOUNTER — Ambulatory Visit: Admission: RE | Admit: 2020-04-16 | Payer: Medicaid Other | Source: Ambulatory Visit

## 2020-04-23 ENCOUNTER — Encounter: Payer: Self-pay | Admitting: Surgical

## 2020-05-02 ENCOUNTER — Encounter: Payer: Self-pay | Admitting: Obstetrics and Gynecology

## 2020-05-02 ENCOUNTER — Ambulatory Visit (INDEPENDENT_AMBULATORY_CARE_PROVIDER_SITE_OTHER): Payer: Medicaid Other | Admitting: Obstetrics and Gynecology

## 2020-05-02 ENCOUNTER — Other Ambulatory Visit: Payer: Medicaid Other

## 2020-05-02 ENCOUNTER — Other Ambulatory Visit: Payer: Self-pay

## 2020-05-02 VITALS — BP 122/63 | HR 77 | Wt 335.4 lb

## 2020-05-02 DIAGNOSIS — Z3482 Encounter for supervision of other normal pregnancy, second trimester: Secondary | ICD-10-CM | POA: Diagnosis not present

## 2020-05-02 DIAGNOSIS — Z23 Encounter for immunization: Secondary | ICD-10-CM | POA: Diagnosis not present

## 2020-05-02 DIAGNOSIS — Z3A28 28 weeks gestation of pregnancy: Secondary | ICD-10-CM

## 2020-05-02 LAB — POCT URINALYSIS DIPSTICK OB
Bilirubin, UA: NEGATIVE
Blood, UA: NEGATIVE
Glucose, UA: NEGATIVE
Ketones, UA: NEGATIVE
Leukocytes, UA: NEGATIVE
Nitrite, UA: NEGATIVE
POC,PROTEIN,UA: NEGATIVE
Spec Grav, UA: 1.01 (ref 1.010–1.025)
Urobilinogen, UA: 0.2 E.U./dL
pH, UA: 7.5 (ref 5.0–8.0)

## 2020-05-02 NOTE — Addendum Note (Signed)
Addended by: Durwin Glaze on: 05/02/2020 10:46 AM   Modules accepted: Orders

## 2020-05-02 NOTE — Progress Notes (Signed)
ROB: Patient doing well.  No complaints.  I have encouraged her to not miss her next ultrasound to complete her anatomy.  She has an anesthesia consult tomorrow and I have strongly encouraged her to attend this as well.

## 2020-05-03 ENCOUNTER — Other Ambulatory Visit
Admission: RE | Admit: 2020-05-03 | Discharge: 2020-05-03 | Disposition: A | Discharge status: Home / Self Care | Payer: Medicaid Other | Source: Ambulatory Visit | Attending: Obstetrics and Gynecology | Admitting: Obstetrics and Gynecology

## 2020-05-03 LAB — CBC
Hematocrit: 30.9 % — ABNORMAL LOW (ref 34.0–46.6)
Hemoglobin: 10 g/dL — ABNORMAL LOW (ref 11.1–15.9)
MCH: 26.6 pg (ref 26.6–33.0)
MCHC: 32.4 g/dL (ref 31.5–35.7)
MCV: 82 fL (ref 79–97)
Platelets: 225 10*3/uL (ref 150–450)
RBC: 3.76 x10E6/uL — ABNORMAL LOW (ref 3.77–5.28)
RDW: 14.2 % (ref 11.7–15.4)
WBC: 7.2 10*3/uL (ref 3.4–10.8)

## 2020-05-03 LAB — HEPATITIS C ANTIBODY: Hep C Virus Ab: 0.1 s/co ratio (ref 0.0–0.9)

## 2020-05-03 LAB — GLUCOSE, 1 HOUR GESTATIONAL: Gestational Diabetes Screen: 100 mg/dL (ref 65–139)

## 2020-05-03 LAB — RPR: RPR Ser Ql: NONREACTIVE

## 2020-05-03 NOTE — Consult Note (Signed)
[facility] Anesthesia Consultation  Joanna [name] PXT:[id number] DOB: [birth year] DOA: 05/03/2020 PCP: [facility] [place]   Requesting physician: Dr. Amalia Hailey Date of consultation: 05/03/20 Reason for consultation: Obesity during pregnancy  CHIEF COMPLAINT:  Obesity during pregnancy  HISTORY OF PRESENT ILLNESS: Joanna [name]  is a 20 y.o. female with a known history of obesity during pregnancy. Prior cesarean delivery, sounds like failure to progress based on patient's description of hx. Would like to TOLAC this time. Had epidural for previous labor and would like epidural for labor analgesia again this time. Denies hx of cardiovascular disease. Hx of childhood asthma. Denies personal or family hx of bleeding disorders.   PAST MEDICAL HISTORY:   Past Medical History:  Diagnosis Date  . Anxiety   . Asthma   . Bipolar 1 disorder ([place])   . Thrombophlebitis 02/2017   septic pelvic venous thrombophlebitis    PAST SURGICAL HISTORY:  Past Surgical History:  Procedure Laterality Date  . CESAREAN SECTION N/A 01/28/2017   Procedure: CESAREAN SECTION;  Surgeon: Harlin Heys, MD;  Location: [facility] ORS;  Service: Obstetrics;  Laterality: N/A;  . CYSTOSCOPY W/ RETROGRADES Right 03/26/2017   Procedure: CYSTOSCOPY WITH RETROGRADE PYELOGRAM;  Surgeon: Abbie Sons, MD;  Location: [facility] ORS;  Service: Urology;  Laterality: Right;  . CYSTOSCOPY W/ URETERAL STENT PLACEMENT Right 02/06/2017   Procedure: CYSTOSCOPY WITH RETROGRADE PYELOGRAM/URETERAL STENT PLACEMENT;  Surgeon: Abbie Sons, MD;  Location: [facility] ORS;  Service: Urology;  Laterality: Right;  . CYSTOSCOPY W/ URETERAL STENT REMOVAL Right 03/26/2017   Procedure: CYSTOSCOPY WITH STENT REMOVAL;  Surgeon: Abbie Sons, MD;  Location: [facility] ORS;  Service: Urology;  Laterality: Right;  . TONSILLECTOMY      SOCIAL HISTORY:  Social History   Tobacco Use  . Smoking status:  Never Smoker  . Smokeless tobacco: Never Used  Substance Use Topics  . Alcohol use: No    FAMILY HISTORY:  Family History  Problem Relation Age of Onset  . Diabetes Mother   . Hypertension Mother   . Cancer Maternal Aunt   . Stroke Maternal Grandmother   . Thyroid disease Maternal Grandmother   . Breast cancer Paternal Grandmother   . Hyperlipidemia Maternal Grandfather   . Diabetes Maternal Grandfather   . Chronic Renal Failure Maternal Grandfather   . Heart Problems Maternal Grandfather        pacemaker   . Hypertension Maternal Grandfather   . Diabetes Paternal Grandfather   . Heart Problems Brother   . Asthma Brother     DRUG ALLERGIES:  Allergies  Allergen Reactions  . Tylenol [Acetaminophen] Swelling    Facial swelling WITH LIQUID TYLENOL    REVIEW OF SYSTEMS:   RESPIRATORY: No cough, shortness of breath, wheezing.  CARDIOVASCULAR: No chest pain, orthopnea, edema.  HEMATOLOGY: No anemia, easy bruising or bleeding SKIN: No rash or lesion. NEUROLOGIC: No tingling, numbness, weakness.  PSYCHIATRY: No anxiety or depression.   MEDICATIONS AT HOME:  Prior to Admission medications   Medication Sig Start Date End Date Taking? Authorizing Provider  metoCLOPramide (REGLAN) 10 MG tablet Take 1 tablet (10 mg total) by mouth every 6 (six) hours as needed for nausea or vomiting. Patient not taking: Reported on 05/02/2020 04/12/20   Rubie Maid, MD  Prenatal Vit-Fe Fumarate-FA (PRENATAL VITAMIN PO) Take by mouth.    [provider]      PHYSICAL EXAMINATION:   VITAL SIGNS: Last menstrual period 09/30/2019, currently breastfeeding.  GENERAL:  20 y.o.-year-old patient no acute distress.  HEENT: Head atraumatic, normocephalic. Oropharynx and nasopharynx clear. MP 1, TM distance >3 cm, normal mouth opening, grade 1 upper lip bite. LUNGS: No use of accessory muscles of respiration.   EXTREMITIES: No pedal edema, cyanosis, or clubbing.  NEUROLOGIC: normal  gait PSYCHIATRIC: The patient is alert and oriented x 3.  SKIN: No obvious rash, lesion, or ulcer.    IMPRESSION AND PLAN:   Joanna [name]  is a 20 y.o. female presenting with obesity during pregnancy. BMI is currently 49 at [redacted] weeks gestation.   Airway exam reassuring. Spinal interspaces minimally palpable.   We discussed analgesic options during labor including epidural analgesia. Discussed that in obesity there can be increased difficulty with epidural placement or even failure of successful epidural. We also discussed that even after successful epidural placement there is increased risk of catheter migration out of the epidural space that would require catheter replacement. Discussed use of epidural vs spinal vs GA if cesarean delivery is required. Discussed increased risk of difficult intubation during pregnancy should an emergency cesarean delivery be required.   Plan for delivery at [facility].

## 2020-05-06 ENCOUNTER — Other Ambulatory Visit: Payer: Self-pay | Admitting: Obstetrics and Gynecology

## 2020-05-06 DIAGNOSIS — O99013 Anemia complicating pregnancy, third trimester: Secondary | ICD-10-CM | POA: Insufficient documentation

## 2020-05-06 MED ORDER — FERROUS SULFATE 325 (65 FE) MG PO TABS: 325.0000 mg | ORAL_TABLET | Freq: Every day | ORAL | 2 refills | Status: DC

## 2020-05-06 MED ORDER — DOCUSATE SODIUM 100 MG PO CAPS: 100.0000 mg | ORAL_CAPSULE | Freq: Two times a day (BID) | ORAL | 2 refills | Status: DC | PRN

## 2020-05-13 ENCOUNTER — Encounter: Payer: Self-pay | Admitting: Emergency Medicine

## 2020-05-13 ENCOUNTER — Emergency Department: Payer: Medicaid Other

## 2020-05-13 ENCOUNTER — Other Ambulatory Visit: Payer: Self-pay

## 2020-05-13 ENCOUNTER — Emergency Department
Admission: EM | Admit: 2020-05-13 | Discharge: 2020-05-13 | Disposition: A | Discharge status: Home / Self Care | Payer: Medicaid Other | Attending: Student in an Organized Health Care Education/Training Program | Admitting: Student in an Organized Health Care Education/Training Program

## 2020-05-13 DIAGNOSIS — J45909 Unspecified asthma, uncomplicated: Secondary | ICD-10-CM | POA: Insufficient documentation

## 2020-05-13 DIAGNOSIS — M79605 Pain in left leg: Secondary | ICD-10-CM | POA: Diagnosis not present

## 2020-05-13 DIAGNOSIS — Z3A28 28 weeks gestation of pregnancy: Secondary | ICD-10-CM | POA: Diagnosis not present

## 2020-05-13 DIAGNOSIS — R102 Pelvic and perineal pain: Secondary | ICD-10-CM | POA: Insufficient documentation

## 2020-05-13 DIAGNOSIS — O26893 Other specified pregnancy related conditions, third trimester: Secondary | ICD-10-CM | POA: Insufficient documentation

## 2020-05-13 HISTORY — DX: Anemia, unspecified: D64.9

## 2020-05-13 NOTE — ED Triage Notes (Signed)
Pt to ED via POV, states L upper leg pain that shoots up into there vaginal area. Pt states vaginal pain when sitting up. Pt states pain has been intermittent and ongoing x 1 week.   This RN called and spoke with midwife upstairs, requesting DVT rule out prior to send patient upstairs for eval.

## 2020-05-13 NOTE — ED Provider Notes (Signed)
[facility] Emergency Department Provider Note   ____________________________________________   Event Date/Time   First MD Initiated Contact with Patient 05/13/20 1338     (approximate)  I have reviewed the triage vital signs and the nursing notes.   HISTORY  Chief Complaint Leg Pain and Vaginal Pain    HPI BETSY [name] is a 20 y.o. female patient sent to the ED by midwife requesting DVT studies secondary to upper leg pain which has been intermitting for 1 week.  Patient will follow-up upstairs in the hospital with the [facility] midwife after the ultrasound of the left lower extremity.  Patient denies chest pain or dyspnea.  Patient is 7 months gestation.  Patient BMI is 48.66.        Of the study. Past Medical History:  Diagnosis Date  . Anemia   . Anxiety   . Asthma   . Bipolar 1 disorder ([place])   . Thrombophlebitis 02/2017   septic pelvic venous thrombophlebitis    Patient Active Problem List   Diagnosis Date Noted  . Anemia of pregnancy in third trimester 05/06/2020  . Decreased fetus movements affecting management of mother in second trimester   . Postoperative wound infection   . Sepsis ([place]) 02/03/2017  . Labor and delivery indication for care or intervention 01/28/2017  . Pregnant 12/24/2016  . Positive urine drug screen 07/09/2016  . Morbid obesity with BMI of 40.0-44.9, adult ([place]) 07/09/2016  . High risk teen pregnancy 07/09/2016  . Asthma 09/27/2012    Past Surgical History:  Procedure Laterality Date  . CESAREAN SECTION N/A 01/28/2017   Procedure: CESAREAN SECTION;  Surgeon: Harlin Heys, MD;  Location: [facility] ORS;  Service: Obstetrics;  Laterality: N/A;  . CYSTOSCOPY W/ RETROGRADES Right 03/26/2017   Procedure: CYSTOSCOPY WITH RETROGRADE PYELOGRAM;  Surgeon: Abbie Sons, MD;  Location: [facility] ORS;  Service: Urology;  Laterality: Right;  . CYSTOSCOPY W/ URETERAL STENT PLACEMENT Right 02/06/2017   Procedure: CYSTOSCOPY  WITH RETROGRADE PYELOGRAM/URETERAL STENT PLACEMENT;  Surgeon: Abbie Sons, MD;  Location: [facility] ORS;  Service: Urology;  Laterality: Right;  . CYSTOSCOPY W/ URETERAL STENT REMOVAL Right 03/26/2017   Procedure: CYSTOSCOPY WITH STENT REMOVAL;  Surgeon: Abbie Sons, MD;  Location: [facility] ORS;  Service: Urology;  Laterality: Right;  . TONSILLECTOMY      Prior to Admission medications   Medication Sig Start Date End Date Taking? Authorizing Provider  docusate sodium (COLACE) 100 MG capsule Take 1 capsule (100 mg total) by mouth 2 (two) times daily as needed. 05/06/20   Rubie Maid, MD  ferrous sulfate (FERROUSUL) 325 (65 FE) MG tablet Take 1 tablet (325 mg total) by mouth daily with breakfast. 05/06/20   Rubie Maid, MD  metoCLOPramide (REGLAN) 10 MG tablet Take 1 tablet (10 mg total) by mouth every 6 (six) hours as needed for nausea or vomiting. Patient not taking: Reported on 05/02/2020 04/12/20   Rubie Maid, MD  Prenatal Vit-Fe Fumarate-FA (PRENATAL VITAMIN PO) Take by mouth.    [provider]    Allergies Tylenol [acetaminophen]  Family History  Problem Relation Age of Onset  . Diabetes Mother   . Hypertension Mother   . Cancer Maternal Aunt   . Stroke Maternal Grandmother   . Thyroid disease Maternal Grandmother   . Breast cancer Paternal Grandmother   . Hyperlipidemia Maternal Grandfather   . Diabetes Maternal Grandfather   . Chronic Renal Failure Maternal Grandfather   . Heart Problems Maternal Grandfather  pacemaker   . Hypertension Maternal Grandfather   . Diabetes Paternal Grandfather   . Heart Problems Brother   . Asthma Brother     Social History Social History   Tobacco Use  . Smoking status: Never Smoker  . Smokeless tobacco: Never Used  Vaping Use  . Vaping Use: Never used  Substance Use Topics  . Alcohol use: No  . Drug use: No    Review of Systems Constitutional: No fever/chills Eyes: No visual changes. ENT: No sore  throat. Cardiovascular: Denies chest pain. Respiratory: Denies shortness of breath. Gastrointestinal: No abdominal pain.  No nausea, no vomiting.  No diarrhea.  No constipation. Genitourinary: Negative for dysuria. Musculoskeletal: Left leg pain. Skin: Negative for rash. Neurological: Negative for headaches, focal weakness or numbness. Psychiatric:  Anxiety and bipolar. Allergic/Immunilogical: Tylenol ____________________________________________   PHYSICAL EXAM:  VITAL SIGNS: ED Triage Vitals  Enc Vitals Group     BP 05/13/20 1241 125/65     Pulse Rate 05/13/20 1241 82     Resp 05/13/20 1241 20     Temp 05/13/20 1241 98.3 F (36.8 C)     Temp Source 05/13/20 1241 Oral     SpO2 05/13/20 1241 99 %     Weight 05/13/20 1242 (!) 320 lb (145.2 kg)     Height 05/13/20 1242 5\' 8"  (1.727 m)     Head Circumference --      Peak Flow --      Pain Score 05/13/20 1241 6     Pain Loc --      Pain Edu? --      Excl. in [place]? --     Constitutional: Alert and oriented. Well appearing and in no acute distress. Eyes: Conjunctivae are normal. PERRL. EOMI. Head: Atraumatic. Nose: No congestion/rhinnorhea. Mouth/Throat: Mucous membranes are moist.  Oropharynx non-erythematous. Neck: No stridor.  Hematological/Lymphatic/Immunilogical: No cervical lymphadenopathy. Cardiovascular: Normal rate, regular rhythm. Grossly normal heart sounds.  Good peripheral circulation. Respiratory: Normal respiratory effort.  No retractions. Lungs CTAB. Gastrointestinal: Soft and nontender. No distention. No abdominal bruits. No CVA tenderness. Genitourinary: Deferred Musculoskeletal: Body habitus  limit the exam.  No obvious deformity.  Patient has some moderate guarding in the left inguinal area. Neurologic:  Normal speech and language. No gross focal neurologic deficits are appreciated. No gait instability. Skin:  Skin is warm, dry and intact. No rash noted. Psychiatric: Mood and affect are normal. Speech and  behavior are normal.  ____________________________________________   LABS (all labs ordered are listed, but only abnormal results are displayed)  Labs Reviewed - No data to display ____________________________________________  EKG   ____________________________________________  RADIOLOGY I, Sable Feil, personally viewed and evaluated these images (plain radiographs) as part of my medical decision making, as well as reviewing the written report by the radiologist.  ED MD interpretation:    Official radiology report(s): US Venous Img Lower Unilateral Left  Result Date: 05/13/2020 CLINICAL DATA:  20 year old female with left leg pain for 1 week. EXAM: LEFT LOWER EXTREMITY VENOUS DOPPLER ULTRASOUND TECHNIQUE: Gray-scale sonography with graded compression, as well as color Doppler and duplex ultrasound were performed to evaluate the left lower extremity deep venous systems from the level of the common femoral vein and including the common femoral, femoral, profunda femoral, popliteal and calf veins including the posterior tibial, peroneal and gastrocnemius veins when visible. Spectral Doppler was utilized to evaluate flow at rest and with distal augmentation maneuvers in the common femoral, femoral and popliteal veins. The contralateral common  femoral vein was also evaluated for comparison. COMPARISON:  02/05/2017 FINDINGS: LEFT LOWER EXTREMITY Common Femoral Vein: No evidence of thrombus. Normal compressibility, respiratory phasicity and response to augmentation. Central Greater Saphenous Vein: No evidence of thrombus. Normal compressibility and flow on color Doppler imaging. Central Profunda Femoral Vein: No evidence of thrombus. Normal compressibility and flow on color Doppler imaging. Femoral Vein: No evidence of thrombus. Normal compressibility, respiratory phasicity and response to augmentation. Popliteal Vein: No evidence of thrombus. Normal compressibility, respiratory phasicity and  response to augmentation. Calf Veins: No evidence of thrombus. Normal compressibility and flow on color Doppler imaging. Other Findings:  None. RIGHT LOWER EXTREMITY Common Femoral Vein: No evidence of thrombus. Normal compressibility, respiratory phasicity and response to augmentation. IMPRESSION: No evidence of left lower extremity deep venous thrombosis. Ruthann Cancer, MD [facility] Radiology Electronically Signed   By: Ruthann Cancer MD   On: 05/13/2020 14:34    ____________________________________________   PROCEDURES  Procedure(s) performed (including Critical Care):  Procedures   ____________________________________________   INITIAL IMPRESSION / ASSESSMENT AND PLAN / ED COURSE  As part of my medical decision making, I reviewed the following data within the [place]         Patient presents with left leg pain with suspicion for DVT as relayed by the midwife to the patient.  Discussed no acute findings or DVT via ultrasound.  Patient is cleared to follow-up with the midwife.      ____________________________________________   FINAL CLINICAL IMPRESSION(S) / ED DIAGNOSES  Final diagnoses:  None     ED Discharge Orders    None      *Please note:  Joanna [name] was evaluated in Emergency Department on 05/13/2020 for the symptoms described in the history of present illness. She was evaluated in the context of the global COVID-19 pandemic, which necessitated consideration that the patient might be at risk for infection with the SARS-CoV-2 virus that causes COVID-19. Institutional protocols and algorithms that pertain to the evaluation of patients at risk for COVID-19 are in a state of rapid change based on information released by regulatory bodies including the CDC and federal and state organizations. These policies and algorithms were followed during the patient's care in the ED.  Some ED evaluations and  interventions may be delayed as a result of limited staffing during and the pandemic.*   Note:  This document was prepared using Dragon voice recognition software and may include unintentional dictation errors.    Sable Feil, PA-C 05/13/20 1448    Merlyn Lot, MD 05/13/20 [phone]

## 2020-05-13 NOTE — ED Triage Notes (Signed)
Pt called and no answer, per family pt went down stairs to get food.

## 2020-05-13 NOTE — Discharge Instructions (Addendum)
No acute findings on ultrasound of the left lower extremities.  Given IM follow-up with the midwife.

## 2020-05-22 ENCOUNTER — Other Ambulatory Visit: Payer: Self-pay

## 2020-05-22 ENCOUNTER — Ambulatory Visit
Admission: RE | Admit: 2020-05-22 | Discharge: 2020-05-22 | Disposition: A | Discharge status: Home / Self Care | Payer: Medicaid Other | Source: Ambulatory Visit | Attending: Obstetrics and Gynecology | Admitting: Obstetrics and Gynecology

## 2020-05-22 DIAGNOSIS — Z3482 Encounter for supervision of other normal pregnancy, second trimester: Secondary | ICD-10-CM | POA: Diagnosis present

## 2020-05-23 NOTE — Patient Instructions (Addendum)
Common Medications Safe in Pregnancy  Acne:      Constipation:  Benzoyl Peroxide     Colace  Clindamycin      Dulcolax Suppository  Topica Erythromycin     Fibercon  Salicylic Acid      Metamucil         Miralax AVOID:        Senakot   Accutane    Cough:  Retin-A       Cough Drops  Tetracycline      Phenergan w/ Codeine if Rx  Minocycline      Robitussin (Plain & DM)  Antibiotics:     Crabs/Lice:  Ceclor       RID  Cephalosporins    AVOID:  E-Mycins      Kwell  Keflex  Macrobid/Macrodantin   Diarrhea:  Penicillin      Kao-Pectate  Zithromax      Imodium AD         PUSH FLUIDS AVOID:       Cipro     Fever:  Tetracycline      Tylenol (Regular or Extra  Minocycline       Strength)  Levaquin      Extra Strength-Do not          Exceed 8 tabs/24 hrs Caffeine:        <200mg/day (equiv. To 1 cup of coffee or  approx. 3 12 oz sodas)         Gas: Cold/Hayfever:       Gas-X  Benadryl      Mylicon  Claritin       Phazyme  **Claritin-D        Chlor-Trimeton    Headaches:  Dimetapp      ASA-Free Excedrin  Drixoral-Non-Drowsy     Cold Compress  Mucinex (Guaifenasin)     Tylenol (Regular or Extra  Sudafed/Sudafed-12 Hour     Strength)  **Sudafed PE Pseudoephedrine   Tylenol Cold & Sinus     Vicks Vapor Rub  Zyrtec  **AVOID if Problems With Blood Pressure         Heartburn: Avoid lying down for at least 1 hour after meals  Aciphex      Maalox     Rash:  Milk of Magnesia     Benadryl    Mylanta       1% Hydrocortisone Cream  Pepcid  Pepcid Complete   Sleep Aids:  Prevacid      Ambien   Prilosec       Benadryl  Rolaids       Chamomile Tea  Tums (Limit 4/day)     Unisom         Tylenol PM         Warm milk-add vanilla or  Hemorrhoids:       Sugar for taste  Anusol/Anusol H.C.  (RX: Analapram 2.5%)  Sugar Substitutes:  Hydrocortisone OTC     Ok in moderation  Preparation H      Tucks        Vaseline lotion applied to tissue with  wiping    Herpes:     Throat:  Acyclovir      Oragel  Famvir  Valtrex     Vaccines:         Flu Shot Leg Cramps:       *Gardasil  Benadryl      Hepatitis A         Hepatitis B Nasal Spray:         Pneumovax  Saline Nasal Spray     Polio Booster         Tetanus Nausea:       Tuberculosis test or PPD  Vitamin B6 25 mg TID   AVOID:    Dramamine      *Gardasil  Emetrol       Live Poliovirus  Ginger Root 250 mg QID    MMR (measles, mumps &  High Complex Carbs @ Bedtime    rebella)  Sea Bands-Accupressure    Varicella (Chickenpox)  Unisom 1/2 tab TID     *No known complications           If received before Pain:         Known pregnancy;   Darvocet       Resume series after  Lortab        Delivery  Percocet    Yeast:   Tramadol      Femstat  Tylenol 3      Gyne-lotrimin  Ultram       Monistat  Vicodin           MISC:         All Sunscreens           Hair Coloring/highlights          Insect Repellant's          (Including DEET)         Mystic Tans     Sciatica  Sciatica is pain, numbness, weakness, or tingling along the path of the sciatic nerve. The sciatic nerve starts in the lower back and runs down the back of each leg. The nerve controls the muscles in the lower leg and in the back of the knee. It also provides feeling (sensation) to the back of the thigh, the lower leg, and the sole of the foot. Sciatica is a symptom of another medical condition that pinches or puts pressure on the sciatic nerve. Sciatica most often only affects one side of the body. Sciatica usually goes away on its own or with treatment. In some cases, sciatica may come back (recur). What are the causes? This condition is caused by pressure on the sciatic nerve or pinching of the nerve. This may be the result of:  A disk in between the bones of the spine bulging out too far (herniated disk).  Age-related changes in the spinal disks.  A pain disorder that affects a muscle in the buttock.  Extra bone  growth near the sciatic nerve.  A break (fracture) of the pelvis.  Pregnancy.  Tumor. This is rare. What increases the risk? The following factors may make you more likely to develop this condition:  Playing sports that place pressure or stress on the spine.  Having poor strength and flexibility.  A history of back injury or surgery.  Sitting for long periods of time.  Doing activities that involve repetitive bending or lifting.  Obesity. What are the signs or symptoms? Symptoms can vary from mild to very severe, and they may include:  Any of these problems in the lower back, leg, hip, or buttock: ? Mild tingling, numbness, or dull aches. ? Burning sensations. ? Sharp pains.  Numbness in the back of the calf or the sole of the foot.  Leg weakness.  Severe back pain that makes movement difficult. Symptoms may get worse when you cough, sneeze, or laugh, or when you sit or stand for long periods of time. How is this   diagnosed? This condition may be diagnosed based on:  Your symptoms and medical history.  A physical exam.  Blood tests.  Imaging tests, such as: ? X-rays. ? MRI. ? CT scan. How is this treated? In many cases, this condition improves on its own without treatment. However, treatment may include:  Reducing or modifying physical activity.  Exercising and stretching.  Icing and applying heat to the affected area.  Medicines that help to: ? Relieve pain and swelling. ? Relax your muscles.  Injections of medicines that help to relieve pain, irritation, and inflammation around the sciatic nerve (steroids).  Surgery. Follow these instructions at home: Medicines  Take over-the-counter and prescription medicines only as told by your health care provider.  Ask your health care provider if the medicine prescribed to you: ? Requires you to avoid driving or using heavy machinery. ? Can cause constipation. You may need to take these actions to prevent  or treat constipation:  Drink enough fluid to keep your urine pale yellow.  Take over-the-counter or prescription medicines.  Eat foods that are high in fiber, such as beans, whole grains, and fresh fruits and vegetables.  Limit foods that are high in fat and processed sugars, such as fried or sweet foods. Managing pain  If directed, put ice on the affected area. ? Put ice in a plastic bag. ? Place a towel between your skin and the bag. ? Leave the ice on for 20 minutes, 2-3 times a day.  If directed, apply heat to the affected area. Use the heat source that your health care provider recommends, such as a moist heat pack or a heating pad. ? Place a towel between your skin and the heat source. ? Leave the heat on for 20-30 minutes. ? Remove the heat if your skin turns bright red. This is especially important if you are unable to feel pain, heat, or cold. You may have a greater risk of getting burned.      Activity  Return to your normal activities as told by your health care provider. Ask your health care provider what activities are safe for you.  Avoid activities that make your symptoms worse.  Take brief periods of rest throughout the day. ? When you rest for longer periods, mix in some mild activity or stretching between periods of rest. This will help to prevent stiffness and pain. ? Avoid sitting for long periods of time without moving. Get up and move around at least one time each hour.  Exercise and stretch regularly, as told by your health care provider.  Do not lift anything that is heavier than 10 lb (4.5 kg) while you have symptoms of sciatica. When you do not have symptoms, you should still avoid heavy lifting, especially repetitive heavy lifting.  When you lift objects, always use proper lifting technique, which includes: ? Bending your knees. ? Keeping the load close to your body. ? Avoiding twisting.   General instructions  Maintain a healthy weight. Excess  weight puts extra stress on your back.  Wear supportive, comfortable shoes. Avoid wearing high heels.  Avoid sleeping on a mattress that is too soft or too hard. A mattress that is firm enough to support your back when you sleep may help to reduce your pain.  Keep all follow-up visits as told by your health care provider. This is important. Contact a health care provider if:  You have pain that: ? Wakes you up when you are sleeping. ? Gets worse when   you lie down. ? Is worse than you have experienced in the past. ? Lasts longer than 4 weeks.  You have an unexplained weight loss. Get help right away if:  You are not able to control when you urinate or have bowel movements (incontinence).  You have: ? Weakness in your lower back, pelvis, buttocks, or legs that gets worse. ? Redness or swelling of your back. ? A burning sensation when you urinate. Summary  Sciatica is pain, numbness, weakness, or tingling along the path of the sciatic nerve.  This condition is caused by pressure on the sciatic nerve or pinching of the nerve.  Sciatica can cause pain, numbness, or tingling in the lower back, legs, hips, and buttocks.  Treatment often includes rest, exercise, medicines, and applying ice or heat. This information is not intended to replace advice given to you by your health care provider. Make sure you discuss any questions you have with your health care provider. Document Revised: 01/17/2018 Document Reviewed: 01/17/2018 Elsevier Patient Education  [place].

## 2020-05-24 ENCOUNTER — Ambulatory Visit (INDEPENDENT_AMBULATORY_CARE_PROVIDER_SITE_OTHER): Payer: Medicaid Other | Admitting: Obstetrics and Gynecology

## 2020-05-24 ENCOUNTER — Encounter: Payer: Self-pay | Admitting: Obstetrics and Gynecology

## 2020-05-24 ENCOUNTER — Other Ambulatory Visit: Payer: Self-pay

## 2020-05-24 VITALS — BP 109/67 | HR 80 | Wt 346.9 lb

## 2020-05-24 DIAGNOSIS — Z3A31 31 weeks gestation of pregnancy: Secondary | ICD-10-CM

## 2020-05-24 DIAGNOSIS — Z6841 Body Mass Index (BMI) 40.0 and over, adult: Secondary | ICD-10-CM

## 2020-05-24 DIAGNOSIS — O3663X0 Maternal care for excessive fetal growth, third trimester, not applicable or unspecified: Secondary | ICD-10-CM

## 2020-05-24 DIAGNOSIS — O09299 Supervision of pregnancy with other poor reproductive or obstetric history, unspecified trimester: Secondary | ICD-10-CM

## 2020-05-24 DIAGNOSIS — Z3483 Encounter for supervision of other normal pregnancy, third trimester: Secondary | ICD-10-CM

## 2020-05-24 DIAGNOSIS — O34219 Maternal care for unspecified type scar from previous cesarean delivery: Secondary | ICD-10-CM

## 2020-05-24 DIAGNOSIS — O26893 Other specified pregnancy related conditions, third trimester: Secondary | ICD-10-CM

## 2020-05-24 DIAGNOSIS — R102 Pelvic and perineal pain: Secondary | ICD-10-CM

## 2020-05-24 DIAGNOSIS — Z98891 History of uterine scar from previous surgery: Secondary | ICD-10-CM

## 2020-05-24 LAB — POCT URINALYSIS DIPSTICK OB
Bilirubin, UA: NEGATIVE
Blood, UA: NEGATIVE
Glucose, UA: NEGATIVE
Ketones, UA: NEGATIVE
Leukocytes, UA: NEGATIVE
Nitrite, UA: NEGATIVE
POC,PROTEIN,UA: NEGATIVE
Spec Grav, UA: 1.01 (ref 1.010–1.025)
Urobilinogen, UA: 0.2 E.U./dL
pH, UA: 7 (ref 5.0–8.0)

## 2020-05-24 NOTE — Progress Notes (Signed)
OB-Pt present for routine prenatal care. Pt c/o vaginal pressure, lower abd pain, pelvic pain and braxton hick contractions.

## 2020-05-24 NOTE — Progress Notes (Signed)
ROB: Complains of pelvic pressure.  Advised on pregnancy girdle. Also notes sharp pain in left leg, mostly with position changes, likely sciatica. Is exercising (goes to the gym). Discussed stretches.  Advised on allergy medications for seasonal allergies. Reviewed recent growth scan, fetus is LGA, 94%ile. Due for repeat growth scan in 3-4 weeks. Notes at this time she still is considering TOLAC but will decide after final ultrasound. Discussed circumcision for female infant.  Discussed pain management with patient. RTC in 2 weeks.    The following were addressed during this visit:  Breastfeeding Education - Risks of giving your baby anything other than breast milk if you are breastfeeding    Comments: Make the baby less content with breastfeeds, may make my baby more susceptible to illness, and may reduce my milk supply.   - Nonpharmacological pain relief methods for labor    Comments: Deep breathing, focusing on pleasant things, movement and walking, heating pads or cold compress, massage and relaxation, continuous support from someone you trust, and Doulas   - The importance of early skin-to-skin contact    Comments: Keeps baby warm and secure, helps keep baby's blood sugar up and breathing steady, easier to bond and breastfeed, and helps calm baby.  - Rooming-in on a 24-hour basis    Comments: Easier to learn baby's feeding cues, easier to bond and get to know each other, and encourages milk production.   - Feeding on demand or baby-led feeding    Comments: Helps prevent breastfeeding complications, helps bring in good milk supply, prevents under or overfeeding, and helps baby feel content and satisfied   - Exclusive breastfeeding for the first 6 months    Comments: Builds a healthy milk supply and keeps it up, protects baby from sickness and disease, and breastmilk has everything your baby needs for the first 6 months.  - Individualized Education    Comments: Contraindications to  breastfeeding and other special medical conditions

## 2020-05-27 ENCOUNTER — Telehealth: Payer: Medicaid Other | Admitting: Emergency Medicine

## 2020-05-27 DIAGNOSIS — N760 Acute vaginitis: Secondary | ICD-10-CM

## 2020-05-27 NOTE — Progress Notes (Signed)
Based on what you shared with me, new vaginal symptoms while pregnant, I feel your condition warrants further evaluation and I recommend that you be seen in a face to face office visit.  It is best to be seen in-person by your OB/GYN for a proper diagnosis for the correct treatment for the health of you and your baby, however, if you cannot get in with your OB/GYN, please go to one of our urgent cares for evaluation and treatment today.    NOTE: If you entered your credit card information for this eVisit, you will not be charged. You may see a "hold" on your card for the $35 but that hold will drop off and you will not have a charge processed.   If you are having a true medical emergency please call 911.      For an urgent face to face visit, [place] has six urgent care centers for your convenience:     [facility] [facility] [place] Get Driving Directions [phone] [address], [place] [address] . 8 am - 4 pm Monday - Friday    [facility]) Get Driving Directions [phone] [address] [place], [place] [address] . 8 am to 8 pm Monday-Friday . 10 am to 6 pm [facility] Get Driving Directions [phone]  [address] [place],  [place]  [address] . 8 am to 8 pm Monday-Friday . 8 am to 4 pm [facility] Get Driving Directions [phone] [place], [place], [place] [address] . 8 am to 8 pm Monday-Friday . 8 am to 4 pm [facility] Get Driving Directions  [phone] [address] [place], [place] [address] . 8 am to 8 pm Monday-Friday . 8 am to 4 pm [facility] [place] Get Driving Directions [phone] [address], [place], [place]  [address] . 8 am to 8 pm Monday-Friday . 8 am to 4 pm Saturday-Sunday     Your MyChart E-visit questionnaire answers were reviewed by a board certified advanced clinical practitioner to complete your personal care plan based on your specific symptoms.  Thank you for using e-Visits.    Approximately 5 minutes was spent documenting and reviewing patient's chart.

## 2020-05-27 NOTE — Progress Notes (Signed)
Thanks,   Yes I spoke with her last week about her scan.

## 2020-05-28 ENCOUNTER — Encounter: Payer: Self-pay | Admitting: Obstetrics and Gynecology

## 2020-05-28 ENCOUNTER — Other Ambulatory Visit: Payer: Self-pay

## 2020-05-28 ENCOUNTER — Ambulatory Visit (INDEPENDENT_AMBULATORY_CARE_PROVIDER_SITE_OTHER): Payer: Medicaid Other | Admitting: Obstetrics and Gynecology

## 2020-05-28 VITALS — BP 116/67 | HR 86 | Wt 343.5 lb

## 2020-05-28 DIAGNOSIS — N898 Other specified noninflammatory disorders of vagina: Secondary | ICD-10-CM

## 2020-05-28 DIAGNOSIS — N760 Acute vaginitis: Secondary | ICD-10-CM

## 2020-05-28 DIAGNOSIS — Z3A31 31 weeks gestation of pregnancy: Secondary | ICD-10-CM

## 2020-05-28 DIAGNOSIS — B9689 Other specified bacterial agents as the cause of diseases classified elsewhere: Secondary | ICD-10-CM

## 2020-05-28 MED ORDER — METRONIDAZOLE 0.75 % VA GEL: 1.0000 | Freq: Every day | VAGINAL | 0 refills | Status: DC

## 2020-05-28 NOTE — Progress Notes (Signed)
OB-Pt present for problem visit. Pt stated that she used feminine wash and noticed vaginal irritation x 3 days. Pt stated that she tried Monistat and still having issues.

## 2020-05-28 NOTE — Patient Instructions (Signed)
Bacterial Vaginosis  Bacterial vaginosis is an infection of the vagina. It happens when too many normal germs (healthy bacteria) grow in the vagina. This infection can make it easier to get other infections from sex (STIs). It is very important for pregnant women to get treated. This infection can cause babies to be born early or at a low birth weight. What are the causes? This infection is caused by an increase in certain germs that grow in the vagina. You cannot get this infection from toilet seats, bedsheets, swimming pools, or things that touch your vagina. What increases the risk?  Having sex with a new person or more than one person.  Having sex without protection.  Douching.  Having an intrauterine device (IUD).  Smoking.  Using drugs or drinking alcohol. These can lead you to do things that are risky.  Taking certain antibiotic medicines.  Being pregnant. What are the signs or symptoms? Some women have no symptoms. Symptoms may include:  A discharge from your vagina. It may be gray or white. It can be watery or foamy.  A fishy smell. This can happen after sex or during your menstrual period.  Itching in and around your vagina.  A feeling of burning or pain when you pee (urinate). How is this treated? This infection is treated with antibiotic medicines. These may be given to you as:  A pill.  A cream for your vagina.  A medicine that you put into your vagina (suppository). If the infection comes back after treatment, you may need more antibiotics. Follow these instructions at home: Medicines  Take over-the-counter and prescription medicines as told by your doctor.  Take or use your antibiotic medicine as told by your doctor. Do not stop taking or using it, even if you start to feel better. General instructions  If the person you have sex with is a woman, tell her that you have this infection. She will need to follow up with her doctor. If you have a female  partner, he does not need to be treated.  Do not have sex until you finish treatment.  Drink enough fluid to keep your pee pale yellow.  Keep your vagina and butt clean. ? Wash the area with warm water each day. ? Wipe from front to back after you use the toilet.  If you are breastfeeding a baby, ask your doctor if you should keep doing so during treatment.  Keep all follow-up visits. How is this prevented? Self-care  Do not douche.  Use only warm water to wash around your vagina.  Wear underwear that is cotton or lined with cotton.  Do not wear tight pants and pantyhose, especially in the summer. Safe sex  Use protection when you have sex. This includes: ? Use condoms. ? Use dental dams. This is a thin layer that protects the mouth during oral sex.  Limit how many people you have sex with. To prevent this infection, it is best to have sex with just one person.  Get tested for STIs. The person you have sex with should also get tested. Drugs and alcohol  Do not smoke or use any products that contain nicotine or tobacco. If you need help quitting, ask your doctor.  Do not use drugs.  Do not drink alcohol if: ? Your doctor tells you not to drink. ? You are pregnant, may be pregnant, or are planning to become pregnant.  If you drink alcohol: ? Limit how much you have to 0-1 drink   a day. ? Know how much alcohol is in your drink. In the [place] one drink equals one 12 oz bottle of beer (355 mL), one 5 oz glass of wine (148 mL), or one 1 oz glass of hard liquor (44 mL). Where to find more information  Centers for Disease Control and Prevention: www.cdc.gov  American Sexual Health Association: www.ashastd.org  Office on Women's Health: www.womenshealth.gov Contact a doctor if:  Your symptoms do not get better, even after you are treated.  You have more discharge or pain when you pee.  You have a fever or chills.  You have pain in your belly (abdomen) or in the area  between your hips.  You have pain with sex.  You bleed from your vagina between menstrual periods. Summary  This infection can happen when too many germs (bacteria) grow in the vagina.  This infection can make it easier to get infections from sex (STIs). Treating this can lower that chance.  Get treated if you are pregnant. This infection can cause babies to be born early.  Do not stop taking or using your antibiotic medicine, even if you start to feel better. This information is not intended to replace advice given to you by your health care provider. Make sure you discuss any questions you have with your health care provider. Document Revised: 06/29/2019 Document Reviewed: 06/29/2019 Elsevier Patient Education  2021 Elsevier Inc.  

## 2020-05-28 NOTE — Progress Notes (Signed)
Problem OB: Notes vaginal irritation after using a feminine wash product  (usually uses Dove soap). Symptoms have been ongoing x 3 days. Tried to use OTC anti-itch cream to treat but made it worse. Denies urinary complaints, vaginal odor, vaginal bleeding or discharge.  Wet prep done, positive for clue cells.  Will treat with Metrogel. RTC for regularly scheduled OB visit.

## 2020-06-04 ENCOUNTER — Observation Stay
Admission: EM | Admit: 2020-06-04 | Discharge: 2020-06-04 | Disposition: A | Discharge status: Home / Self Care | Payer: Medicaid Other | Attending: Obstetrics and Gynecology | Admitting: Obstetrics and Gynecology

## 2020-06-04 ENCOUNTER — Encounter: Payer: Self-pay | Admitting: Obstetrics and Gynecology

## 2020-06-04 ENCOUNTER — Other Ambulatory Visit: Payer: Self-pay

## 2020-06-04 DIAGNOSIS — G43909 Migraine, unspecified, not intractable, without status migrainosus: Secondary | ICD-10-CM | POA: Insufficient documentation

## 2020-06-04 DIAGNOSIS — Z3A32 32 weeks gestation of pregnancy: Secondary | ICD-10-CM | POA: Insufficient documentation

## 2020-06-04 DIAGNOSIS — O99353 Diseases of the nervous system complicating pregnancy, third trimester: Secondary | ICD-10-CM | POA: Diagnosis not present

## 2020-06-04 DIAGNOSIS — O26899 Other specified pregnancy related conditions, unspecified trimester: Secondary | ICD-10-CM

## 2020-06-04 DIAGNOSIS — O26893 Other specified pregnancy related conditions, third trimester: Secondary | ICD-10-CM | POA: Diagnosis present

## 2020-06-04 DIAGNOSIS — Z8669 Personal history of other diseases of the nervous system and sense organs: Secondary | ICD-10-CM

## 2020-06-04 DIAGNOSIS — O98513 Other viral diseases complicating pregnancy, third trimester: Secondary | ICD-10-CM | POA: Diagnosis not present

## 2020-06-04 DIAGNOSIS — Z8759 Personal history of other complications of pregnancy, childbirth and the puerperium: Secondary | ICD-10-CM

## 2020-06-04 DIAGNOSIS — U071 COVID-19: Secondary | ICD-10-CM

## 2020-06-04 HISTORY — DX: Headache, unspecified: R51.9

## 2020-06-04 LAB — URINALYSIS, ROUTINE W REFLEX MICROSCOPIC
Bilirubin Urine: NEGATIVE
Glucose, UA: NEGATIVE mg/dL
Hgb urine dipstick: NEGATIVE
Ketones, ur: 80 mg/dL — AB
Leukocytes,Ua: NEGATIVE
Nitrite: NEGATIVE
Protein, ur: 30 mg/dL — AB
Specific Gravity, Urine: 1.024 (ref 1.005–1.030)
pH: 6 (ref 5.0–8.0)

## 2020-06-04 LAB — WET PREP, GENITAL
Clue Cells Wet Prep HPF POC: NONE SEEN
Sperm: NONE SEEN
Trich, Wet Prep: NONE SEEN
Yeast Wet Prep HPF POC: NONE SEEN

## 2020-06-04 LAB — CBC WITH DIFFERENTIAL/PLATELET
Abs Immature Granulocytes: 0.02 10*3/uL (ref 0.00–0.07)
Basophils Absolute: 0 10*3/uL (ref 0.0–0.1)
Basophils Relative: 0 %
Eosinophils Absolute: 0 10*3/uL (ref 0.0–0.5)
Eosinophils Relative: 0 %
HCT: 27.3 % — ABNORMAL LOW (ref 36.0–46.0)
Hemoglobin: 8.9 g/dL — ABNORMAL LOW (ref 12.0–15.0)
Immature Granulocytes: 0 %
Lymphocytes Relative: 5 %
Lymphs Abs: 0.3 10*3/uL — ABNORMAL LOW (ref 0.7–4.0)
MCH: 26.8 pg (ref 26.0–34.0)
MCHC: 32.6 g/dL (ref 30.0–36.0)
MCV: 82.2 fL (ref 80.0–100.0)
Monocytes Absolute: 0.6 10*3/uL (ref 0.1–1.0)
Monocytes Relative: 12 %
Neutro Abs: 4.4 10*3/uL (ref 1.7–7.7)
Neutrophils Relative %: 83 %
Platelets: 173 10*3/uL (ref 150–400)
RBC: 3.32 MIL/uL — ABNORMAL LOW (ref 3.87–5.11)
RDW: 14.7 % (ref 11.5–15.5)
WBC: 5.3 10*3/uL (ref 4.0–10.5)
nRBC: 0 % (ref 0.0–0.2)

## 2020-06-04 LAB — COMPREHENSIVE METABOLIC PANEL
ALT: 11 U/L (ref 0–44)
AST: 16 U/L (ref 15–41)
Albumin: 2.8 g/dL — ABNORMAL LOW (ref 3.5–5.0)
Alkaline Phosphatase: 69 U/L (ref 38–126)
Anion gap: 8 (ref 5–15)
BUN: 7 mg/dL (ref 6–20)
CO2: 20 mmol/L — ABNORMAL LOW (ref 22–32)
Calcium: 8.4 mg/dL — ABNORMAL LOW (ref 8.9–10.3)
Chloride: 104 mmol/L (ref 98–111)
Creatinine, Ser: 0.46 mg/dL (ref 0.44–1.00)
GFR, Estimated: >60 mL/min (ref >60)
Glucose, Bld: 77 mg/dL (ref 70–99)
Potassium: 3.4 mmol/L — ABNORMAL LOW (ref 3.5–5.1)
Sodium: 132 mmol/L — ABNORMAL LOW (ref 135–145)
Total Bilirubin: 1.1 mg/dL (ref 0.3–1.2)
Total Protein: 6 g/dL — ABNORMAL LOW (ref 6.5–8.1)

## 2020-06-04 LAB — RESP PANEL BY RT-PCR (FLU A&B, COVID) ARPGX2
Influenza A by PCR: NEGATIVE
Influenza B by PCR: NEGATIVE
SARS Coronavirus 2 by RT PCR: POSITIVE — AB

## 2020-06-04 LAB — RUPTURE OF MEMBRANE (ROM)PLUS: Rom Plus: NEGATIVE

## 2020-06-04 LAB — FETAL FIBRONECTIN: Fetal Fibronectin: NEGATIVE

## 2020-06-04 MED ORDER — ONDANSETRON 4 MG PO TBDP
4.0000 mg | ORAL_TABLET | Freq: Four times a day (QID) | ORAL | Status: DC | PRN
  Administered 2020-06-04: 4 mg via ORAL
  Filled 2020-06-04 (×2): qty 1

## 2020-06-04 MED ORDER — OXYCODONE-ACETAMINOPHEN 5-325 MG PO TABS
1.0000 | ORAL_TABLET | Freq: Four times a day (QID) | ORAL | Status: DC | PRN
  Administered 2020-06-04: 1 via ORAL
  Filled 2020-06-04: qty 2

## 2020-06-04 NOTE — Final Progress Note (Signed)
L&D OB Triage Note  Joanna [name] is a 20 y.o. G71P1001 female at [redacted]w[redacted]d, [place] Estimated Date of Delivery: 07/24/20 who presented to triage for complaints of migraine headache since yesterday and significant pelvic pressure.  Migraine is associated with nausea. She reported taking Tylenol last night, with no improvement in pregnancy.  She was evaluated by the nurses, who later noted abnormalities in patient's blood pressures (low diastolic pressures and slowly declining systolic blood pressures).  Later, patient eventually developed a fever. An NST was performed and has been reviewed by MD. She was treated with Percocet for migraine, IVF bolus for fever and contractions.     Physical Exam:  Vitals:   06/04/20 1949 06/04/20 1950 06/04/20 2012 06/04/20 2027  BP: (!) 133/43  (!) 120/50 (!) 114/48  Pulse: 98  (!) 101 96  Temp:    98.7 F (37.1 C)  Resp:      SpO2:  99%    TempSrc:    Oral   Tmax of 101.2 at 1814 pm.  BP range 103-141/31-67 Pulse range: 101-118   NST INTERPRETATION: Indications: rule out uterine contractions  Mode: External Baseline Rate (A): 130 bpm Variability: Moderate Accelerations: 15 x 15 Decelerations: None     Contraction Frequency (min): mild UI  Impression: reactive     Labs:  Results for orders placed or performed during the hospital encounter of 06/04/20  Resp Panel by RT-PCR (Flu A&B, Covid) Nasopharyngeal Swab   Specimen: Nasopharyngeal Swab; Nasopharyngeal(NP) swabs in vial transport medium  Result Value Ref Range   SARS Coronavirus 2 by RT PCR POSITIVE (A) NEGATIVE   Influenza A by PCR NEGATIVE NEGATIVE   Influenza B by PCR NEGATIVE NEGATIVE  Wet prep, genital  Result Value Ref Range   Yeast Wet Prep HPF POC NONE SEEN NONE SEEN   Trich, Wet Prep NONE SEEN NONE SEEN   Clue Cells Wet Prep HPF POC NONE SEEN NONE SEEN   WBC, Wet Prep HPF POC FEW (A) NONE SEEN   Sperm NONE SEEN   Fetal fibronectin  Result Value Ref Range   Fetal  Fibronectin NEGATIVE NEGATIVE  ROM Plus ([facility] only)  Result Value Ref Range   Rom Plus NEGATIVE   Urinalysis, Routine w reflex microscopic Urine, Clean Catch  Result Value Ref Range   Color, Urine YELLOW (A) YELLOW   APPearance HAZY (A) CLEAR   Specific Gravity, Urine 1.024 1.005 - 1.030   pH 6.0 5.0 - 8.0   Glucose, UA NEGATIVE NEGATIVE mg/dL   Hgb urine dipstick NEGATIVE NEGATIVE   Bilirubin Urine NEGATIVE NEGATIVE   Ketones, ur 80 (A) NEGATIVE mg/dL   Protein, ur 30 (A) NEGATIVE mg/dL   Nitrite NEGATIVE NEGATIVE   Leukocytes,Ua NEGATIVE NEGATIVE   RBC / HPF 0-5 0 - 5 RBC/hpf   WBC, UA 0-5 0 - 5 WBC/hpf   Bacteria, UA RARE (A) NONE SEEN   Squamous Epithelial / LPF 0-5 0 - 5   Mucus PRESENT   CBC with Differential/Platelet  Result Value Ref Range   WBC 5.3 4.0 - 10.5 K/uL   RBC 3.32 (L) 3.87 - 5.11 MIL/uL   Hemoglobin 8.9 (L) 12.0 - 15.0 g/dL   HCT 27.3 (L) 36.0 - 46.0 %   MCV 82.2 80.0 - 100.0 fL   MCH 26.8 26.0 - 34.0 pg   MCHC 32.6 30.0 - 36.0 g/dL   RDW 14.7 11.5 - 15.5 %   Platelets 173 150 - 400 K/uL   nRBC 0.0  0.0 - 0.2 %   Neutrophils Relative % 83 %   Neutro Abs 4.4 1.7 - 7.7 K/uL   Lymphocytes Relative 5 %   Lymphs Abs 0.3 (L) 0.7 - 4.0 K/uL   Monocytes Relative 12 %   Monocytes Absolute 0.6 0.1 - 1.0 K/uL   Eosinophils Relative 0 %   Eosinophils Absolute 0.0 0.0 - 0.5 K/uL   Basophils Relative 0 %   Basophils Absolute 0.0 0.0 - 0.1 K/uL   Immature Granulocytes 0 %   Abs Immature Granulocytes 0.02 0.00 - 0.07 K/uL  Comprehensive metabolic panel  Result Value Ref Range   Sodium 132 (L) 135 - 145 mmol/L   Potassium 3.4 (L) 3.5 - 5.1 mmol/L   Chloride 104 98 - 111 mmol/L   CO2 20 (L) 22 - 32 mmol/L   Glucose, Bld 77 70 - 99 mg/dL   BUN 7 6 - 20 mg/dL   Creatinine, Ser 0.46 0.44 - 1.00 mg/dL   Calcium 8.4 (L) 8.9 - 10.3 mg/dL   Total Protein 6.0 (L) 6.5 - 8.1 g/dL   Albumin 2.8 (L) 3.5 - 5.0 g/dL   AST 16 15 - 41 U/L   ALT 11 0 - 44 U/L   Alkaline  Phosphatase 69 38 - 126 U/L   Total Bilirubin 1.1 0.3 - 1.2 mg/dL   GFR, Estimated >60 >60 mL/min   Anion gap 8 5 - 15    Plan: NST performed was reviewed and was found to be reactive.  She was noted to be COVID positive. Migraine headache improved with use of Percocet.  She was discharged home with COVID and preterm labor precautions precautions.  Continue routine prenatal care (will change next visit to televisit). Follow up with OB/GYN as previously scheduled.     Rubie Maid, MD

## 2020-06-04 NOTE — Discharge Instructions (Signed)
Pregnancy and COVID-19 Pregnant women and women who were recently pregnant are at an increased risk for severe illness from COVID-19. Other conditions, such as being pregnant at an older age or having diabetes or obesity, can further increase the risk of severe illness from COVID-19. This risk can last for at least 42 days following the end of the pregnancy. Protect yourself and your baby by:  Knowing your risk factors. Ask your health care provider about your specific risk factors.  Working with your health care team to protect yourself against all infections, including COVID-19. How does COVID-19 affect me? If you get COVID-19 while pregnant or shortly after your pregnancy, there is an increased risk that you may:  Get a respiratory illness that can lead to pneumonia or severe illness.  Give birth to your baby before 37 weeks of pregnancy (preterm birth).  Have other complications that can affect your pregnancy. How does COVID-19 affect my care? If you have COVID-19, special precautions will be taken around your pregnancy:  You will have to notify the clinic or hospital before a visit. Steps will be taken to protect other people from the virus, including seeing you in a special room.  Tests and scans may be done differently before delivery (prenatal care).  Your birth plan may change, including what room you will be in and who may be with you during labor and delivery.  You may stay longer in the hospital after delivery (postpartum care).  COVID-19 will affect where your baby will stay after delivery. Ask about the risks and benefits of staying in the same room with your baby. Benefits include breastfeeding and mother-newborn bonding.  You may have to feed your baby differently.  Visitors will be limited after your baby is born. How does COVID-19 affect my baby? It is very rare for a mother with COVID-19 to pass the virus to the unborn baby. After birth, a baby can get the virus if  he or she is exposed to it. Ask your health care provider about ways to protect your baby. The baby can be placed in an incubator. A physical barrier can also be used. What can I do to lower my risk? Medicines and vaccines  You can receive a COVID-19 vaccination. This can protect you from severe illness. If you have concerns, talk to your health care provider.  Get other recommended vaccines, including the flu vaccine and the whooping cough (Tdap) vaccine.  Ask your health care provider if you can get a 30-day, or longer, supply of your medicines, so you can make fewer trips to the pharmacy.  If you have received a COVID-19 vaccine, consider enrolling in the v-safe program from the CDC. This program uses an app on your smartphone to provide check-ins and gather information on your health after you receive the vaccine. There is a separate registry for pregnant women. For more information, visit: ? V-safe tool: http://boyd.org/ ? V-safe pregnancy registry: SuperiorMarketers.be Cleaning and personal hygiene If you are in isolation for COVID-19 and are sharing a room with your newborn, take these steps to reduce the risk of spreading the virus to your newborn:  Wash your hands with soap and water for at least 20 seconds before holding or caring for your baby. If soap and water are not available, use alcohol-based hand sanitizer.  Wear a mask when within 6 feet (1.8 m) of your baby.  Keep your baby more than 6 feet (1.8 m) away from you as much as possible.  Avoid touching your mouth, face, eyes, or nose before washing your hands.  Clean and disinfect objects and surfaces that are frequently touched.   Other things to do  Avoid people who might have been exposed to or infected with COVID-19, including people who live with you.  Cover your mouth and nose by wearing a mask or other cloth covering over your face when you go out in public.  Avoid people who are not wearing  a mask.  Avoid large crowds. Maintain at least 6 feet (1.8 m) between yourself and others.  Avoid poorly ventilated spaces.  Call your health care provider if you have any health concerns. ? Contact your health care provider right away if you think you have COVID-19. Tell your health care provider that you think you may have a COVID-19 infection and that you are pregnant. Breastfeeding tips Plan with your family and health care team how to feed your baby. Current research shows that the virus may not pass to a baby through breast milk. If you are breastfeeding, you can receive a COVID-19 vaccine. The vaccines pose no risk for breastfeeding mothers or their babies.  Some of the vaccines might create antibodies in breast milk. These antibodies can help to protect your baby. Take precautions if you have or may have COVID-19. Precautions include:  Washing hands with soap and water for at least 20 seconds before feeding your baby. If soap and water are not available, use alcohol-based hand sanitizer.  Wearing a mask while feeding your baby.  Pumping or expressing breast milk to feed to your baby. If possible, ask someone in your household who is not sick to feed your baby the expressed breast milk. ? Wash your hands with soap and water for at least 20 seconds before touching pump parts. ? Wash and disinfect all pump parts after expressing milk. Follow the manufacturer's instructions to clean and disinfect all pump parts. Follow these instructions: Managing stress Some pregnant and postpartum women may have fear, uncertainty, and stress because of COVID-19. Find ways to manage stress. These may include:  Using relaxation techniques such as meditation and deep breathing.  Getting regular exercise. Most women can continue their usual exercise routine during pregnancy. Ask your health care provider what activities are safe for you.  Seeking support from family, friends, or spiritual resources.  If you cannot be together in person, you can still connect by phone calls, texts, video calls, or online messaging.  Doing relaxing activities that you enjoy, such as listening to music or reading a good book. General instructions  Follow your health care provider's instructions on taking medicines. Some medicines may not be safe to take during pregnancy.  Ask for help if you have counseling or nutritional needs. Your health care provider can offer advice or refer you to resources or specialists who can help you with various needs.  Keep all follow-up visits. This is important. This includes visits before and after you have your baby. Questions to ask your health care team  What should I do if I have COVID-19 symptoms?  What are the side effects that can occur after receiving any of the available COVID-19 vaccines?  How will COVID-19 affect my prenatal care visits, tests and scans, labor and delivery, and postpartum care?  What are the risks of COVID-19 to me and the potential risks to my unborn baby or infant?  How do vaccines pass antibodies to my unborn baby?  Should I plan to breastfeed my baby?  Where can I find mental health resources?  Where can I find support if I have financial concerns? Where to find more information  CDC: PopCorners.nl  World Health Organization [facility]): HandicappingSports.nl  [employer] (ACOG): www.acog.org Contact a health care provider if:  You have signs and symptoms of infection, including a fever or cough. ? Tell your health care team that you think you may have a COVID-19 infection and that you are pregnant.  You have strong emotions, such as sadness or anxiety.  You feel unsafe in your home and need help finding a safe place to live.  You have bloody or watery vaginal discharge or vaginal bleeding. Get help right away if:  You have signs or symptoms of labor  before 37 weeks of pregnancy. These include: ? Contractions that are 5 minutes or less apart, or that increase in frequency, intensity, or length. ? Sudden, sharp pain in the abdomen or in the lower back. ? A gush or trickle of fluid from your vagina.  You have signs of more serious illness, such as: ? Trouble breathing. ? Chest pain. ? A fever of 102.62F (39C) or higher that does not go away. ? Vomiting every time you drink fluids. ? Feeling extremely weak. ? Fainting. These symptoms may represent a serious problem that is an emergency. Do not wait to see if the symptoms will go away. Get medical help right away. Call your local emergency services (911 in the [place] Do not drive yourself to the hospital. Summary  Pregnant women and women who were recently pregnant are at an increased risk for severe illness from COVID-19.  Take precautions to protect yourself and your baby. Wear a mask. Wash hands often. Avoid touching your mouth, face, eyes, or nose before washing hands. Avoid large groups of people and stay away from people who are sick.  If you think you have a COVID-19 infection, contact your health care provider right away. Tell your health care provider that you think you have COVID-19 and that you are pregnant.  If you have COVID-19, special precautions may be taken during pregnancy, labor and delivery, and after delivery. This information is not intended to replace advice given to you by your health care provider. Make sure you discuss any questions you have with your health care provider. Document Revised: 11/13/2019 Document Reviewed: 11/13/2019 Elsevier Patient Education  2021 [place] Can Do to Manage Your COVID-19 Symptoms at Home If you have possible or confirmed COVID-19: 1. Stay home except to get medical care. 2. Monitor your symptoms carefully. If your symptoms get worse, call your healthcare provider immediately. 3. Get rest and stay  hydrated. 4. If you have a medical appointment, call the healthcare provider ahead of time and tell them that you have or may have COVID-19. 5. For medical emergencies, call 911 and notify the dispatch personnel that you have or may have COVID-19. 6. Cover your cough and sneezes with a tissue or use the inside of your elbow. 7. Wash your hands often with soap and water for at least 20 seconds or clean your hands with an alcohol-based hand sanitizer that contains at least 60% alcohol. 8. As much as possible, stay in a specific room and away from other people in your home. Also, you should use a separate bathroom, if available. If you need to be around other people in or outside of the home, wear a mask. 9. Avoid sharing personal  items with other people in your household, like dishes, towels, and bedding. 10. Clean all surfaces that are touched often, like counters, tabletops, and doorknobs. Use household cleaning sprays or wipes according to the label instructions. [employer] 07/28/2019 This information is not intended to replace advice given to you by your health care provider. Make sure you discuss any questions you have with your health care provider. Document Revised: 11/13/2019 Document Reviewed: 11/13/2019 Elsevier Patient Education  2021 Elsevier Inc.    Common Medications Safe in Pregnancy  Acne:      Constipation:  Benzoyl Peroxide     Colace  Clindamycin      Dulcolax Suppository  Topica Erythromycin     Fibercon  Salicylic Acid      Metamucil         Miralax AVOID:        Senakot   Accutane    Cough:  Retin-A       Cough Drops  Tetracycline      Phenergan w/ Codeine if Rx  Minocycline      Robitussin (Plain & DM)  Antibiotics:     Crabs/Lice:  Ceclor       RID  Cephalosporins    AVOID:  E-Mycins      Kwell  Keflex  Macrobid/Macrodantin   Diarrhea:  Penicillin      Kao-Pectate  Zithromax      Imodium AD         PUSH  FLUIDS AVOID:       Cipro     Fever:  Tetracycline      Tylenol (Regular or Extra  Minocycline       Strength)  Levaquin      Extra Strength-Do not          Exceed 8 tabs/24 hrs Caffeine:        200mg /day (equiv. To 1 cup of coffee or  approx. 3 12 oz sodas)         Gas: Cold/Hayfever:       Gas-X  Benadryl      Mylicon  Claritin       Phazyme  **Claritin-D        Chlor-Trimeton    Headaches:  Dimetapp      ASA-Free Excedrin  Drixoral-Non-Drowsy     Cold Compress  Mucinex (Guaifenasin)     Tylenol (Regular or Extra  Sudafed/Sudafed-12 Hour     Strength)  **Sudafed PE Pseudoephedrine   Tylenol Cold & Sinus     Vicks Vapor Rub  Zyrtec  **AVOID if Problems With Blood Pressure         Heartburn: Avoid lying down for at least 1 hour after meals  Aciphex      Maalox     Rash:  Milk of Magnesia     Benadryl    Mylanta       1% Hydrocortisone Cream  Pepcid  Pepcid Complete   Sleep Aids:  Prevacid      Ambien   Prilosec       Benadryl  Rolaids       Chamomile Tea  Tums (Limit 4/day)     Unisom         Tylenol PM         Warm milk-add vanilla or  Hemorrhoids:       Sugar for taste  Anusol/Anusol H.C.  (RX: Analapram 2.5%)  Sugar Substitutes:  Hydrocortisone OTC     Ok in moderation  Preparation H  Tucks        Vaseline lotion applied to tissue with wiping    Herpes:     Throat:  Acyclovir      Oragel  Famvir  Valtrex     Vaccines:         Flu Shot Leg Cramps:       *Gardasil  Benadryl      Hepatitis A         Hepatitis B Nasal Spray:       Pneumovax  Saline Nasal Spray     Polio Booster         Tetanus Nausea:       Tuberculosis test or PPD  Vitamin B6 25 mg TID   AVOID:    Dramamine      *Gardasil  Emetrol       Live Poliovirus  Ginger Root 250 mg QID    MMR (measles, mumps &  High Complex Carbs @ Bedtime    rebella)  Sea Bands-Accupressure    Varicella (Chickenpox)  Unisom 1/2 tab TID     *No known complications           If received  before Pain:         Known pregnancy;   Darvocet       Resume series after  Lortab        Delivery  Percocet    Yeast:   Tramadol      Femstat  Tylenol 3      Gyne-lotrimin  Ultram       Monistat  Vicodin           MISC:         All Sunscreens           Hair Coloring/highlights          Insect Repellant's          (Including DEET)         Mystic Tans

## 2020-06-05 DIAGNOSIS — O98513 Other viral diseases complicating pregnancy, third trimester: Secondary | ICD-10-CM

## 2020-06-05 DIAGNOSIS — U071 COVID-19: Secondary | ICD-10-CM

## 2020-06-06 ENCOUNTER — Encounter: Payer: Medicaid Other | Admitting: Obstetrics and Gynecology

## 2020-06-06 LAB — URINE CULTURE

## 2020-06-13 ENCOUNTER — Encounter: Payer: Self-pay | Admitting: Obstetrics and Gynecology

## 2020-06-20 NOTE — Progress Notes (Signed)
OB-Pt present for routine prenatal care. Pt sent a mychart message yesterday stating that she was having sharp pains in the vaginal area along with heartburn and nausea. Pt stated that she got sick this morning due to the acid reflux and heartburn.

## 2020-06-20 NOTE — Patient Instructions (Signed)
Heartburn During Pregnancy  Heartburn is a type of pain or discomfort in the throat or chest. It may cause a burning feeling. It happens when stomach acid backs up into the part of the body that moves food from your mouth to your stomach (esophagus). This condition is also called acid reflux. Heartburn is common during pregnancy. It usually goes away or gets better after giving birth. What are the causes? This condition is caused by stomach acid that backs up into the part of the body that moves food from your mouth to your stomach. Acid can back up because of: Changing amounts of hormones in the body. Large meals. Certain foods and drinks. Exercise. More acid being made in the stomach. What increases the risk? You are more likely to develop this condition if: You had heartburn before you became pregnant. You have been pregnant more than once before. You are overweight or obese. Heartburn is also likely to happen as you get further along in your pregnancy. The risk is higher in the last 3 months before birth (third trimester). What are the signs or symptoms? Symptoms of this condition include: Burning pain in the chest or lower throat. A bitter taste in the mouth. Coughing. Problems swallowing. Vomiting. A hoarse voice. Asthma. Symptoms may get worse when you lie down or bend over. You may feel worse at night. How is this treated? Treatment for this condition depends on how bad your symptoms are. Your doctor may ask you to: Take over-the-counter medicines for mild heartburn. These medicines include antacids or acid reducers. Take prescription medicines to reduce stomach acid or to protect your stomach. Change your diet. Raise the head of your bed so it is higher than the foot of the bed. Follow these instructions at home: Eating and drinking Do not drink alcohol while you are pregnant. Learn which foods and drinks make you feel worse, and avoid them. Eat small meals often, instead  of large meals. Avoid drinking a lot of liquid with your meals. Avoid eating meals during the 2-3 hours before you go to bed. Avoid lying down right after you eat. Do not exercise right after you eat. Drinks to avoid Coffee and tea (with or without caffeine). Energy drinks and sports drinks. Carbonated drinks or sodas. Citrus fruit juices. Foods to avoid Chocolate and cocoa. Peppermint and mint flavorings. Garlic, onions, and horseradish. Spicy foods and foods that have a lot of acid in them. These include peppers, chili powder, curry powder, vinegar, hot sauces, and barbecue sauce. Citrus fruits, such as oranges, lemons, and limes. Tomato-based foods, such as red sauce, chili, and salsa. Fried and fatty foods, such as donuts, french fries, potato chips, and high-fat dressings. High-fat meats, such as hot dogs, precooked or cured meats, sausage, ham, and bacon. High-fat dairy items, such as whole milk, butter, and cheese. Medicines Take over-the-counter and prescription medicines only as told by your doctor. Do not take aspirin or NSAIDs, such as ibuprofen, unless your doctor tells you to do that. Your doctor may tell you to avoid medicines that have sodium bicarbonate in them. General instructions If told, raise the head of your bed about 6 inches (15 cm). You can do this by putting blocks under the legs. Sleeping with more pillows does not help with heartburn. Do not use any products that contain nicotine or tobacco, such as cigarettes, e-cigarettes, and chewing tobacco. If you need help quitting, ask your doctor. Wear loose-fitting clothing. Try to lower your stress, such as with  yoga or meditation. If you need help, ask your doctor. Stay at a healthy weight. If you are overweight, work with your doctor to safely manage your weight. Keep all follow-up visits as told by your doctor. This is important. Where to find more information American Pregnancy Association:  americanpregnancy.org Contact a doctor if: You get new symptoms. Your symptoms do not get better with treatment. You lose weight and you do not know why. You have trouble swallowing. You make loud sounds when you breathe (wheeze). You have a cough that does not go away. You have heartburn often for more than 2 weeks. You feel like you may vomit (nausea), or you vomit, and this does not get better with treatment. You have pain in your belly (abdomen). Get help right away if: You have very bad chest pain that spreads to your arm, neck, or jaw. You feel sweaty, dizzy, or light-headed. You have trouble breathing. You have pain when swallowing. You vomit, and your vomit looks like blood or coffee grounds. Your poop (stool) is bloody or black. Summary Heartburn in pregnancy is common, especially during the last 3 months before birth. This condition is caused by stomach acid backing up into the part of the body that moves food from the mouth to the stomach. This condition can be treated with medicines, changes to your diet, or raising the head of your bed. Contact a doctor if your symptoms do not go away or you get new symptoms. This information is not intended to replace advice given to you by your health care provider. Make sure you discuss any questions you have with your health care provider. Document Revised: 09/21/2018 Document Reviewed: 09/21/2018 Elsevier Patient Education  [place]. Common Medications Safe in Pregnancy  Acne:      Constipation:  Benzoyl Peroxide     Colace  Clindamycin      Dulcolax Suppository  Topica Erythromycin     Fibercon  Salicylic Acid      Metamucil         Miralax AVOID:        Senakot   Accutane    Cough:  Retin-A       Cough Drops  Tetracycline      Phenergan w/ Codeine if Rx  Minocycline      Robitussin (Plain &  DM)  Antibiotics:     Crabs/Lice:  Ceclor       RID  Cephalosporins    AVOID:  E-Mycins      Kwell  Keflex  Macrobid/Macrodantin   Diarrhea:  Penicillin      Kao-Pectate  Zithromax      Imodium AD         PUSH FLUIDS AVOID:       Cipro     Fever:  Tetracycline      Tylenol (Regular or Extra  Minocycline       Strength)  Levaquin      Extra Strength-Do not          Exceed 8 tabs/24 hrs Caffeine:        <232m/day (equiv. To 1 cup of coffee or  approx. 3 12 oz sodas)         Gas: Cold/Hayfever:       Gas-X  Benadryl      Mylicon  Claritin       Phazyme  **Claritin-D        Chlor-Trimeton    Headaches:  Dimetapp      ASA-Free Excedrin  Drixoral-Non-Drowsy     Cold Compress  Mucinex (Guaifenasin)     Tylenol (Regular or Extra  Sudafed/Sudafed-12 Hour     Strength)  **Sudafed PE Pseudoephedrine   Tylenol Cold & Sinus     Vicks Vapor Rub  Zyrtec  **AVOID if Problems With Blood Pressure         Heartburn: Avoid lying down for at least 1 hour after meals  Aciphex      Maalox     Rash:  Milk of Magnesia     Benadryl    Mylanta       1% Hydrocortisone Cream  Pepcid  Pepcid Complete   Sleep Aids:  Prevacid      Ambien   Prilosec       Benadryl  Rolaids       Chamomile Tea  Tums (Limit 4/day)     Unisom         Tylenol PM         Warm milk-add vanilla or  Hemorrhoids:       Sugar for taste  Anusol/Anusol H.C.  (RX: Analapram 2.5%)  Sugar Substitutes:  Hydrocortisone OTC     Ok in moderation  Preparation H      Tucks        Vaseline lotion applied to tissue with wiping    Herpes:     Throat:  Acyclovir      Oragel  Famvir  Valtrex     Vaccines:         Flu Shot Leg Cramps:       *Gardasil  Benadryl      Hepatitis A         Hepatitis B Nasal Spray:       Pneumovax  Saline Nasal Spray     Polio Booster         Tetanus Nausea:       Tuberculosis test or PPD  Vitamin B6 25 mg TID   AVOID:    Dramamine      *Gardasil  Emetrol       Live Poliovirus  Ginger  Root 250 mg QID    MMR (measles, mumps &  High Complex Carbs @ Bedtime    rebella)  Sea Bands-Accupressure    Varicella (Chickenpox)  Unisom 1/2 tab TID     *No known complications           If received before Pain:         Known pregnancy;   Darvocet       Resume series after  Lortab        Delivery  Percocet    Yeast:   Tramadol      Femstat  Tylenol 3      Gyne-lotrimin  Ultram       Monistat  Vicodin           MISC:         All Sunscreens           Hair Coloring/highlights          Insect Repellant's          (Including DEET)         Mystic Tans

## 2020-06-21 ENCOUNTER — Other Ambulatory Visit: Payer: Self-pay

## 2020-06-21 ENCOUNTER — Ambulatory Visit (INDEPENDENT_AMBULATORY_CARE_PROVIDER_SITE_OTHER): Payer: Medicaid Other | Admitting: Obstetrics and Gynecology

## 2020-06-21 ENCOUNTER — Encounter: Payer: Self-pay | Admitting: Obstetrics and Gynecology

## 2020-06-21 VITALS — BP 122/69 | HR 87 | Wt 347.3 lb

## 2020-06-21 DIAGNOSIS — U071 COVID-19: Secondary | ICD-10-CM

## 2020-06-21 DIAGNOSIS — Z8616 Personal history of COVID-19: Secondary | ICD-10-CM

## 2020-06-21 DIAGNOSIS — O99210 Obesity complicating pregnancy, unspecified trimester: Secondary | ICD-10-CM

## 2020-06-21 DIAGNOSIS — Z3A00 Weeks of gestation of pregnancy not specified: Secondary | ICD-10-CM

## 2020-06-21 DIAGNOSIS — R12 Heartburn: Secondary | ICD-10-CM

## 2020-06-21 DIAGNOSIS — R059 Cough, unspecified: Secondary | ICD-10-CM

## 2020-06-21 DIAGNOSIS — Z3483 Encounter for supervision of other normal pregnancy, third trimester: Secondary | ICD-10-CM

## 2020-06-21 DIAGNOSIS — N9489 Other specified conditions associated with female genital organs and menstrual cycle: Secondary | ICD-10-CM

## 2020-06-21 DIAGNOSIS — R5383 Other fatigue: Secondary | ICD-10-CM

## 2020-06-21 DIAGNOSIS — O34219 Maternal care for unspecified type scar from previous cesarean delivery: Secondary | ICD-10-CM

## 2020-06-21 DIAGNOSIS — O99891 Other specified diseases and conditions complicating pregnancy: Secondary | ICD-10-CM

## 2020-06-21 DIAGNOSIS — O3663X0 Maternal care for excessive fetal growth, third trimester, not applicable or unspecified: Secondary | ICD-10-CM

## 2020-06-21 DIAGNOSIS — O26893 Other specified pregnancy related conditions, third trimester: Secondary | ICD-10-CM

## 2020-06-21 DIAGNOSIS — O09299 Supervision of pregnancy with other poor reproductive or obstetric history, unspecified trimester: Secondary | ICD-10-CM | POA: Insufficient documentation

## 2020-06-21 DIAGNOSIS — O26899 Other specified pregnancy related conditions, unspecified trimester: Secondary | ICD-10-CM

## 2020-06-21 DIAGNOSIS — Z3A35 35 weeks gestation of pregnancy: Secondary | ICD-10-CM

## 2020-06-21 DIAGNOSIS — O98513 Other viral diseases complicating pregnancy, third trimester: Secondary | ICD-10-CM

## 2020-06-21 DIAGNOSIS — R112 Nausea with vomiting, unspecified: Secondary | ICD-10-CM

## 2020-06-21 LAB — POCT URINALYSIS DIPSTICK OB
Bilirubin, UA: NEGATIVE
Blood, UA: NEGATIVE
Glucose, UA: NEGATIVE
Ketones, UA: NEGATIVE
Leukocytes, UA: NEGATIVE
Nitrite, UA: NEGATIVE
POC,PROTEIN,UA: NEGATIVE
Spec Grav, UA: 1.02 (ref 1.010–1.025)
Urobilinogen, UA: 0.2 E.U./dL
pH, UA: 6.5 (ref 5.0–8.0)

## 2020-06-21 MED ORDER — PANTOPRAZOLE SODIUM 40 MG PO TBEC
40.0000 mg | DELAYED_RELEASE_TABLET | Freq: Two times a day (BID) | ORAL | 3 refills | Status: DC
Start: 2020-06-21 — End: 2020-07-21

## 2020-06-21 NOTE — Progress Notes (Signed)
ROB: Patient reports fatigue. Recently had COVID infection 2 weeks ago, recent testing was negative but still has lingering cough. Discussed continued use of throat lozenges, robitussin. Also noting heartburn causing nausea and vomiting.  Tried Prilosec OTC but not helping. Will prescribe. Also noting pelvic pressure despite use of belly band. Advised on use of carrier wrap.    Is due for next growth scan, will order. Last scan with LGA infant (94%ile) last month. H/o macrosomia. Discussed patient's desires for TOLAC.  Based on current size I discussed that she may want to reconsider cesarean delivery as current fetus may be as large or larger. Patient notes understanding. Will wait until ultrasound to make decision. RTC in 1 week. To begin NST's then.

## 2020-06-26 ENCOUNTER — Other Ambulatory Visit: Payer: Self-pay

## 2020-06-26 ENCOUNTER — Ambulatory Visit
Admission: RE | Admit: 2020-06-26 | Discharge: 2020-06-26 | Disposition: A | Discharge status: Home / Self Care | Payer: Medicaid Other | Source: Ambulatory Visit | Attending: Obstetrics and Gynecology | Admitting: Obstetrics and Gynecology

## 2020-06-26 ENCOUNTER — Encounter: Payer: Medicaid Other | Admitting: Obstetrics and Gynecology

## 2020-06-26 ENCOUNTER — Other Ambulatory Visit: Payer: Medicaid Other

## 2020-06-26 DIAGNOSIS — O3663X0 Maternal care for excessive fetal growth, third trimester, not applicable or unspecified: Secondary | ICD-10-CM | POA: Diagnosis not present

## 2020-06-26 DIAGNOSIS — Z3A36 36 weeks gestation of pregnancy: Secondary | ICD-10-CM | POA: Insufficient documentation

## 2020-06-26 DIAGNOSIS — O99213 Obesity complicating pregnancy, third trimester: Secondary | ICD-10-CM | POA: Diagnosis not present

## 2020-06-26 DIAGNOSIS — Z3483 Encounter for supervision of other normal pregnancy, third trimester: Secondary | ICD-10-CM

## 2020-06-26 DIAGNOSIS — O99210 Obesity complicating pregnancy, unspecified trimester: Secondary | ICD-10-CM

## 2020-07-04 ENCOUNTER — Encounter: Payer: Self-pay | Admitting: Obstetrics and Gynecology

## 2020-07-04 ENCOUNTER — Other Ambulatory Visit: Payer: Self-pay

## 2020-07-04 ENCOUNTER — Ambulatory Visit (INDEPENDENT_AMBULATORY_CARE_PROVIDER_SITE_OTHER): Payer: Medicaid Other | Admitting: Obstetrics and Gynecology

## 2020-07-04 ENCOUNTER — Other Ambulatory Visit: Payer: Medicaid Other

## 2020-07-04 VITALS — BP 135/71 | HR 88 | Wt 354.3 lb

## 2020-07-04 DIAGNOSIS — Z3A37 37 weeks gestation of pregnancy: Secondary | ICD-10-CM | POA: Diagnosis not present

## 2020-07-04 DIAGNOSIS — O99213 Obesity complicating pregnancy, third trimester: Secondary | ICD-10-CM

## 2020-07-04 DIAGNOSIS — Z3483 Encounter for supervision of other normal pregnancy, third trimester: Secondary | ICD-10-CM

## 2020-07-04 LAB — POCT URINALYSIS DIPSTICK OB
Bilirubin, UA: NEGATIVE
Blood, UA: NEGATIVE
Glucose, UA: NEGATIVE
Ketones, UA: NEGATIVE
Leukocytes, UA: NEGATIVE
Nitrite, UA: NEGATIVE
POC,PROTEIN,UA: NEGATIVE
Spec Grav, UA: 1.01 (ref 1.010–1.025)
Urobilinogen, UA: 0.2 E.U./dL
pH, UA: 7.5 (ref 5.0–8.0)

## 2020-07-04 NOTE — Progress Notes (Signed)
OB-Pt present for routine prenatal care and NST. Pt stated having bloody show and clots after sex. Pt c/o braxton hick contractions and lower abd pain.

## 2020-07-04 NOTE — Progress Notes (Signed)
ROB: Patient notes "bloody show" after intercourse.  Also noting [address] and lower abdominal pain. Most recent growth US showing some mild decrease in growth (now 86%ile vs previously 92%ile).  Patient still notes that she desires to try for TOLAC despite failed IOL with last pregnancy as this baby seems a little smaller. Can schedule at next visit. NST performed today was reviewed and was found to be reactive.  Continue recommended antenatal testing and prenatal care.   NONSTRESS TEST INTERPRETATION  INDICATIONS: Obesity  FHR baseline: 135 bpm RESULTS:Reactive COMMENTS: Occasional ctx   PLAN: 1. Continue fetal kick counts twice a day. 2. Continue antepartum testing as scheduled-weekly

## 2020-07-10 ENCOUNTER — Ambulatory Visit (INDEPENDENT_AMBULATORY_CARE_PROVIDER_SITE_OTHER): Payer: Medicaid Other | Admitting: Obstetrics and Gynecology

## 2020-07-10 ENCOUNTER — Encounter: Payer: Self-pay | Admitting: Obstetrics and Gynecology

## 2020-07-10 ENCOUNTER — Other Ambulatory Visit: Payer: Self-pay

## 2020-07-10 ENCOUNTER — Other Ambulatory Visit: Payer: Medicaid Other

## 2020-07-10 VITALS — BP 153/65 | HR 83 | Wt 360.8 lb

## 2020-07-10 DIAGNOSIS — Z3A38 38 weeks gestation of pregnancy: Secondary | ICD-10-CM

## 2020-07-10 DIAGNOSIS — O99213 Obesity complicating pregnancy, third trimester: Secondary | ICD-10-CM

## 2020-07-10 DIAGNOSIS — Z3483 Encounter for supervision of other normal pregnancy, third trimester: Secondary | ICD-10-CM

## 2020-07-10 LAB — POCT URINALYSIS DIPSTICK OB
Bilirubin, UA: NEGATIVE
Blood, UA: NEGATIVE
Glucose, UA: NEGATIVE
Ketones, UA: NEGATIVE
Leukocytes, UA: NEGATIVE
Nitrite, UA: NEGATIVE
Spec Grav, UA: 1.01 (ref 1.010–1.025)
Urobilinogen, UA: 0.2 E.U./dL
pH, UA: 7 (ref 5.0–8.0)

## 2020-07-10 NOTE — Progress Notes (Signed)
ROB: No complaints-denies contractions.  NST today reactive.  We once again addressed Tolac versus repeat cesarean delivery.  Foley bulb and induction of labor discussed in detail.  Timing of induction of labor discussed.  VBAC calculator and the rationale for calculator discussed.  Chance of vaginal birth based on calculator 16%.  Patient has increased fetal growth at the 89th percentile by recent ultrasound.  She has a history of previous cephalopelvic disproportion.  Risks and benefits of cesarean versus Tolak discussed in detail.  Patient's mother present by video chat.  All questions answered.  Joanna [name] has decided upon repeat cesarean delivery and states that she is comfortable and sure of her decision.  Cesarean delivery scheduled for Friday next week. Needs COVID testing after visit next week.

## 2020-07-16 NOTE — Addendum Note (Signed)
Addended by: [name] on: 07/16/2020 02:58 PM   Modules accepted: Orders, SmartSet

## 2020-07-17 ENCOUNTER — Other Ambulatory Visit: Payer: Self-pay

## 2020-07-17 ENCOUNTER — Encounter
Admission: RE | Admit: 2020-07-17 | Discharge: 2020-07-17 | Disposition: A | Discharge status: Home / Self Care | Payer: Medicaid Other | Source: Ambulatory Visit | Attending: Obstetrics and Gynecology | Admitting: Obstetrics and Gynecology

## 2020-07-17 ENCOUNTER — Encounter: Payer: Medicaid Other | Admitting: Obstetrics and Gynecology

## 2020-07-17 ENCOUNTER — Other Ambulatory Visit: Payer: Medicaid Other

## 2020-07-17 ENCOUNTER — Other Ambulatory Visit
Admission: RE | Admit: 2020-07-17 | Discharge: 2020-07-17 | Disposition: A | Discharge status: Home / Self Care | Payer: Medicaid Other | Source: Ambulatory Visit | Attending: Obstetrics and Gynecology | Admitting: Obstetrics and Gynecology

## 2020-07-17 DIAGNOSIS — Z01812 Encounter for preprocedural laboratory examination: Secondary | ICD-10-CM | POA: Insufficient documentation

## 2020-07-17 DIAGNOSIS — Z20822 Contact with and (suspected) exposure to covid-19: Secondary | ICD-10-CM | POA: Diagnosis not present

## 2020-07-17 HISTORY — DX: Personal history of urinary calculi: Z87.442

## 2020-07-17 HISTORY — DX: Other complications of anesthesia, initial encounter: T88.59XA

## 2020-07-17 HISTORY — DX: Gastro-esophageal reflux disease without esophagitis: K21.9

## 2020-07-17 LAB — TYPE AND SCREEN
ABO/RH(D): B POS
Antibody Screen: NEGATIVE
Extend sample reason: UNDETERMINED

## 2020-07-17 LAB — CBC
HCT: 27.9 % — ABNORMAL LOW (ref 36.0–46.0)
Hemoglobin: 9 g/dL — ABNORMAL LOW (ref 12.0–15.0)
MCH: 26.5 pg (ref 26.0–34.0)
MCHC: 32.3 g/dL (ref 30.0–36.0)
MCV: 82.1 fL (ref 80.0–100.0)
Platelets: 188 10*3/uL (ref 150–400)
RBC: 3.4 MIL/uL — ABNORMAL LOW (ref 3.87–5.11)
RDW: 15.8 % — ABNORMAL HIGH (ref 11.5–15.5)
WBC: 7.6 10*3/uL (ref 4.0–10.5)
nRBC: 0 % (ref 0.0–0.2)

## 2020-07-17 LAB — SARS CORONAVIRUS 2 (TAT 6-24 HRS): SARS Coronavirus 2: NEGATIVE

## 2020-07-17 NOTE — Patient Instructions (Signed)
Your procedure is scheduled on:07-19-20 Friday Report to the [facility] you arrive call Labor and Delivery at [phone] and someone from L&D or Security will come down to the [place] entrance and escort you up to L&D-Arrive @ 5:30 AM  REMEMBER: Instructions that are not followed completely may result in serious medical risk, up to and including death; or upon the discretion of your surgeon and anesthesiologist your surgery may need to be rescheduled.  Do not eat food after midnight the night before surgery.  No gum chewing, lozengers or hard candies.  You may however, drink CLEAR liquids up to 2 hours before you are scheduled to arrive for your surgery. Do not drink anything within 2 hours of your scheduled arrival time.  Clear liquids include: - water  - apple juice without pulp - gatorade (not RED, PURPLE, OR BLUE) - black coffee or tea (Do NOT add milk or creamers to the coffee or tea) Do NOT drink anything that is not on this list.  TAKE THESE MEDICATIONS THE MORNING OF SURGERY WITH A SIP OF WATER: -Protonix (Pantoprazole)-take one the night before and one on the morning of surgery - helps to prevent nausea after surgery  One week prior to surgery: Stop Anti-inflammatories (NSAIDS) such as Advil, Aleve, Ibuprofen, Motrin, Naproxen, Naprosyn and Aspirin based products such as Excedrin, Goodys Powder, BC Powder You may however, continue to take Tylenol if needed for pain up until the day of surgery.  No Alcohol for 24 hours before or after surgery.  No Smoking including e-cigarettes for 24 hours prior to surgery.  No chewable tobacco products for at least 6 hours prior to surgery.  No nicotine patches on the day of surgery.  Do not use any "recreational" drugs for at least a week prior to your surgery.  Please be advised that the combination of cocaine and anesthesia may have negative outcomes, up to and including death. If you test positive for cocaine, your surgery  will be cancelled.  On the morning of surgery brush your teeth with toothpaste and water, you may rinse your mouth with mouthwash if you wish. Do not swallow any toothpaste or mouthwash.  Do not wear jewelry, make-up, hairpins, clips or nail polish.  Do not wear lotions, powders, or perfumes.   Do not shave body from the neck down 48 hours prior to surgery just in case you cut yourself which could leave a site for infection.  Also, freshly shaved skin may become irritated if using the CHG soap.  Contact lenses, hearing aids and dentures may not be worn into surgery.  Do not bring valuables to the hospital. [facility] is not responsible for any missing/lost belongings or valuables.   Notify your doctor if there is any change in your medical condition (cold, fever, infection).  Wear comfortable clothing (specific to your surgery type) to the hospital.  After surgery, you can help prevent lung complications by doing breathing exercises.  Take deep breaths and cough every 1-2 hours. Your doctor may order a device called an Incentive Spirometer to help you take deep breaths. When coughing or sneezing, hold a pillow firmly against your incision with both hands. This is called "splinting." Doing this helps protect your incision. It also decreases belly discomfort.  If you are being admitted to the hospital overnight, leave your suitcase in the car. After surgery it may be brought to your room.  If you are being discharged the day of surgery, you will not be allowed  to drive home. You will need a responsible adult (18 years or older) to drive you home and stay with you that night.   If you are taking public transportation, you will need to have a responsible adult (18 years or older) with you. Please confirm with your physician that it is acceptable to use public transportation.   Please call the [facility] [facility] Dept. at [phone] if you have any questions about these  instructions.  Surgery Visitation Policy:  Patients undergoing a surgery or procedure may have one family member or support person with them as long as that person is not COVID-19 positive or experiencing its symptoms.  That person may remain in the waiting area during the procedure.  Inpatient Visitation:    Visiting hours are 7 a.m. to 8 p.m. Inpatients will be allowed two visitors daily. The visitors may change each day during the patient's stay. No visitors under the age of 71. Any visitor under the age of 5 must be accompanied by an adult. The visitor must pass COVID-19 screenings, use hand sanitizer when entering and exiting the patient's room and wear a mask at all times, including in the patient's room. Patients must also wear a mask when staff or their visitor are in the room. Masking is required regardless of vaccination status.

## 2020-07-18 MED ORDER — LACTATED RINGERS IV SOLN: INTRAVENOUS | Status: DC

## 2020-07-18 MED ORDER — LACTATED RINGERS IV SOLN
Freq: Once | INTRAVENOUS | Status: AC
  Administered 2020-07-19: 1000 mL via INTRAVENOUS

## 2020-07-18 MED ORDER — LACTATED RINGERS IV SOLN: Freq: Once | INTRAVENOUS | Status: DC

## 2020-07-18 MED ORDER — POVIDONE-IODINE 10 % EX SWAB: 2.0000 "application " | Freq: Once | CUTANEOUS | Status: DC

## 2020-07-19 ENCOUNTER — Inpatient Hospital Stay
Admission: RE | Admit: 2020-07-19 | Discharge: 2020-07-21 | DRG: 788 | Disposition: A | Discharge status: Home / Self Care | Payer: Medicaid Other | Attending: Obstetrics and Gynecology | Admitting: Obstetrics and Gynecology

## 2020-07-19 ENCOUNTER — Other Ambulatory Visit: Payer: Self-pay

## 2020-07-19 ENCOUNTER — Encounter: Payer: Self-pay | Admitting: Obstetrics and Gynecology

## 2020-07-19 ENCOUNTER — Inpatient Hospital Stay: Payer: Medicaid Other | Admitting: Anesthesiology

## 2020-07-19 ENCOUNTER — Encounter
Admission: RE | Disposition: A | Discharge status: Home / Self Care | Payer: Self-pay | Source: Home / Self Care | Attending: Obstetrics and Gynecology

## 2020-07-19 DIAGNOSIS — O9962 Diseases of the digestive system complicating childbirth: Secondary | ICD-10-CM | POA: Diagnosis present

## 2020-07-19 DIAGNOSIS — O3663X0 Maternal care for excessive fetal growth, third trimester, not applicable or unspecified: Secondary | ICD-10-CM

## 2020-07-19 DIAGNOSIS — Z8616 Personal history of COVID-19: Secondary | ICD-10-CM

## 2020-07-19 DIAGNOSIS — O99213 Obesity complicating pregnancy, third trimester: Secondary | ICD-10-CM | POA: Diagnosis not present

## 2020-07-19 DIAGNOSIS — O99214 Obesity complicating childbirth: Secondary | ICD-10-CM | POA: Diagnosis present

## 2020-07-19 DIAGNOSIS — K219 Gastro-esophageal reflux disease without esophagitis: Secondary | ICD-10-CM | POA: Diagnosis present

## 2020-07-19 DIAGNOSIS — Z87442 Personal history of urinary calculi: Secondary | ICD-10-CM | POA: Diagnosis not present

## 2020-07-19 DIAGNOSIS — Z9889 Other specified postprocedural states: Secondary | ICD-10-CM

## 2020-07-19 DIAGNOSIS — O34211 Maternal care for low transverse scar from previous cesarean delivery: Principal | ICD-10-CM | POA: Diagnosis present

## 2020-07-19 DIAGNOSIS — Z3A39 39 weeks gestation of pregnancy: Secondary | ICD-10-CM

## 2020-07-19 DIAGNOSIS — O09299 Supervision of pregnancy with other poor reproductive or obstetric history, unspecified trimester: Secondary | ICD-10-CM

## 2020-07-19 HISTORY — DX: Maternal care for excessive fetal growth, third trimester, not applicable or unspecified: O36.63X0

## 2020-07-19 HISTORY — DX: Other specified postprocedural states: Z98.890

## 2020-07-19 SURGERY — Surgical Case
Anesthesia: Spinal

## 2020-07-19 MED ORDER — SCOPOLAMINE 1 MG/3DAYS TD PT72
1.0000 | MEDICATED_PATCH | Freq: Once | TRANSDERMAL | Status: DC
  Administered 2020-07-19: 1.5 mg via TRANSDERMAL
  Filled 2020-07-19: qty 1

## 2020-07-19 MED ORDER — KETOROLAC TROMETHAMINE 30 MG/ML IJ SOLN: 30.0000 mg | Freq: Four times a day (QID) | INTRAMUSCULAR | Status: AC

## 2020-07-19 MED ORDER — SOD CITRATE-CITRIC ACID 500-334 MG/5ML PO SOLN
ORAL | Status: AC
  Administered 2020-07-19: 30 mL
  Filled 2020-07-19: qty 15

## 2020-07-19 MED ORDER — MENTHOL 3 MG MT LOZG
1.0000 | LOZENGE | OROMUCOSAL | Status: DC | PRN
  Filled 2020-07-19: qty 9

## 2020-07-19 MED ORDER — MORPHINE SULFATE (PF) 0.5 MG/ML IJ SOLN
INTRAMUSCULAR | Status: DC | PRN
  Administered 2020-07-19: 100 ug via INTRATHECAL

## 2020-07-19 MED ORDER — LIDOCAINE 5 % EX PTCH
MEDICATED_PATCH | CUTANEOUS | Status: AC
  Administered 2020-07-19: 1
  Filled 2020-07-19: qty 1

## 2020-07-19 MED ORDER — FENTANYL CITRATE (PF) 100 MCG/2ML IJ SOLN
INTRAMUSCULAR | Status: DC | PRN
  Administered 2020-07-19: 10 ug via INTRATHECAL

## 2020-07-19 MED ORDER — OXYTOCIN-SODIUM CHLORIDE 30-0.9 UT/500ML-% IV SOLN
INTRAVENOUS | Status: DC | PRN
  Administered 2020-07-19: 500 mL via INTRAVENOUS

## 2020-07-19 MED ORDER — NALOXONE HCL 0.4 MG/ML IJ SOLN
0.4000 mg | INTRAMUSCULAR | Status: DC | PRN
  Filled 2020-07-19: qty 1

## 2020-07-19 MED ORDER — METHYLERGONOVINE MALEATE 0.2 MG/ML IJ SOLN
INTRAMUSCULAR | Status: AC
  Filled 2020-07-19: qty 1

## 2020-07-19 MED ORDER — MORPHINE SULFATE (PF) 0.5 MG/ML IJ SOLN: INTRAMUSCULAR | Status: DC | PRN

## 2020-07-19 MED ORDER — OXYTOCIN-SODIUM CHLORIDE 30-0.9 UT/500ML-% IV SOLN
INTRAVENOUS | Status: AC
  Filled 2020-07-19: qty 500

## 2020-07-19 MED ORDER — NALOXONE HCL 4 MG/10ML IJ SOLN
1.0000 ug/kg/h | INTRAVENOUS | Status: DC | PRN
  Filled 2020-07-19: qty 5

## 2020-07-19 MED ORDER — NALBUPHINE HCL 10 MG/ML IJ SOLN
5.0000 mg | INTRAMUSCULAR | Status: DC | PRN
  Filled 2020-07-19: qty 0.5

## 2020-07-19 MED ORDER — IBUPROFEN 600 MG PO TABS
600.0000 mg | ORAL_TABLET | Freq: Four times a day (QID) | ORAL | Status: DC
  Administered 2020-07-20 – 2020-07-21 (×3): 600 mg via ORAL
  Filled 2020-07-19 (×3): qty 1

## 2020-07-19 MED ORDER — DEXTROSE 5 % IV SOLN
INTRAVENOUS | Status: DC | PRN
  Administered 2020-07-19: 3 g via INTRAVENOUS

## 2020-07-19 MED ORDER — FENTANYL CITRATE (PF) 100 MCG/2ML IJ SOLN: INTRAMUSCULAR | Status: DC | PRN

## 2020-07-19 MED ORDER — BUPIVACAINE IN DEXTROSE 0.75-8.25 % IT SOLN
INTRATHECAL | Status: DC | PRN
  Administered 2020-07-19: 1.5 mL via INTRATHECAL

## 2020-07-19 MED ORDER — PHENYLEPHRINE HCL-NACL 10-0.9 MG/250ML-% IV SOLN
INTRAVENOUS | Status: DC | PRN
  Administered 2020-07-19: 50 ug/min via INTRAVENOUS

## 2020-07-19 MED ORDER — CEFAZOLIN IN SODIUM CHLORIDE 3-0.9 GM/100ML-% IV SOLN
3.0000 g | Freq: Once | INTRAVENOUS | Status: DC
  Filled 2020-07-19: qty 100

## 2020-07-19 MED ORDER — OXYTOCIN-SODIUM CHLORIDE 30-0.9 UT/500ML-% IV SOLN
2.5000 [IU]/h | INTRAVENOUS | Status: AC
  Administered 2020-07-19: 2.5 [IU]/h via INTRAVENOUS
  Filled 2020-07-19: qty 500

## 2020-07-19 MED ORDER — PHENYLEPHRINE HCL (PRESSORS) 10 MG/ML IV SOLN
INTRAVENOUS | Status: DC | PRN
  Administered 2020-07-19 (×3): 100 ug via INTRAVENOUS

## 2020-07-19 MED ORDER — LACTATED RINGERS IV SOLN: INTRAVENOUS | Status: DC

## 2020-07-19 MED ORDER — KETOROLAC TROMETHAMINE 30 MG/ML IJ SOLN: 30.0000 mg | Freq: Four times a day (QID) | INTRAMUSCULAR | Status: DC

## 2020-07-19 MED ORDER — MEPERIDINE HCL 25 MG/ML IJ SOLN: 6.2500 mg | INTRAMUSCULAR | Status: DC | PRN

## 2020-07-19 MED ORDER — ACETAMINOPHEN 500 MG PO TABS
1000.0000 mg | ORAL_TABLET | Freq: Four times a day (QID) | ORAL | Status: AC
  Administered 2020-07-19 – 2020-07-20 (×4): 1000 mg via ORAL
  Filled 2020-07-19 (×4): qty 2

## 2020-07-19 MED ORDER — FENTANYL CITRATE (PF) 100 MCG/2ML IJ SOLN
INTRAMUSCULAR | Status: AC
  Filled 2020-07-19: qty 2

## 2020-07-19 MED ORDER — PHENYLEPHRINE HCL (PRESSORS) 10 MG/ML IV SOLN
INTRAVENOUS | Status: AC
  Filled 2020-07-19: qty 1

## 2020-07-19 MED ORDER — NALBUPHINE HCL 10 MG/ML IJ SOLN
5.0000 mg | Freq: Once | INTRAMUSCULAR | Status: AC | PRN
  Filled 2020-07-19: qty 0.5

## 2020-07-19 MED ORDER — KETOROLAC TROMETHAMINE 30 MG/ML IJ SOLN
30.0000 mg | Freq: Four times a day (QID) | INTRAMUSCULAR | Status: AC
  Administered 2020-07-19 – 2020-07-20 (×4): 30 mg via INTRAVENOUS
  Filled 2020-07-19 (×4): qty 1

## 2020-07-19 MED ORDER — OXYCODONE HCL 5 MG PO TABS
5.0000 mg | ORAL_TABLET | ORAL | Status: DC | PRN
Start: 2020-07-19 — End: 2020-07-21

## 2020-07-19 MED ORDER — OXYCODONE-ACETAMINOPHEN 5-325 MG PO TABS: 1.0000 | ORAL_TABLET | ORAL | Status: DC | PRN

## 2020-07-19 MED ORDER — NALBUPHINE HCL 10 MG/ML IJ SOLN
5.0000 mg | Freq: Once | INTRAMUSCULAR | Status: AC | PRN
  Administered 2020-07-19: 5 mg via INTRAVENOUS
  Filled 2020-07-19: qty 0.5

## 2020-07-19 MED ORDER — SIMETHICONE 80 MG PO CHEW
80.0000 mg | CHEWABLE_TABLET | Freq: Four times a day (QID) | ORAL | Status: DC
  Administered 2020-07-19 – 2020-07-21 (×7): 80 mg via ORAL
  Filled 2020-07-19 (×8): qty 1

## 2020-07-19 MED ORDER — ONDANSETRON HCL 4 MG/2ML IJ SOLN: 4.0000 mg | Freq: Three times a day (TID) | INTRAMUSCULAR | Status: DC | PRN

## 2020-07-19 MED ORDER — MORPHINE SULFATE (PF) 0.5 MG/ML IJ SOLN
INTRAMUSCULAR | Status: AC
  Filled 2020-07-19: qty 10

## 2020-07-19 MED ORDER — ONDANSETRON HCL 4 MG/2ML IJ SOLN
INTRAMUSCULAR | Status: DC | PRN
  Administered 2020-07-19: 4 mg via INTRAVENOUS

## 2020-07-19 MED ORDER — NALBUPHINE HCL 10 MG/ML IJ SOLN
5.0000 mg | INTRAMUSCULAR | Status: DC | PRN
Start: 2020-07-19 — End: 2020-07-21
  Administered 2020-07-19: 5 mg via INTRAVENOUS
  Filled 2020-07-19 (×2): qty 1
  Filled 2020-07-19: qty 0.5

## 2020-07-19 MED ORDER — SODIUM CHLORIDE 0.9% FLUSH: 3.0000 mL | INTRAVENOUS | Status: DC | PRN

## 2020-07-19 MED ORDER — LIDOCAINE HCL (PF) 2 % IJ SOLN
INTRAMUSCULAR | Status: AC
  Filled 2020-07-19: qty 5

## 2020-07-19 MED ORDER — OXYCODONE HCL 5 MG PO TABS: 10.0000 mg | ORAL_TABLET | ORAL | Status: DC | PRN

## 2020-07-19 SURGICAL SUPPLY — 29 items
ADHESIVE MASTISOL STRL (MISCELLANEOUS) ×2 IMPLANT
BACTOSHIELD CHG 4% 4OZ (MISCELLANEOUS) ×1
BAG COUNTER SPONGE EZ (MISCELLANEOUS) ×2 IMPLANT
CHLORAPREP W/TINT 26 (MISCELLANEOUS) ×4 IMPLANT
DERMABOND ADHESIVE PROPEN (GAUZE/BANDAGES/DRESSINGS) ×2
DERMABOND ADVANCED .7 DNX6 (GAUZE/BANDAGES/DRESSINGS) ×2 IMPLANT
DRSG TELFA 3X8 NADH (GAUZE/BANDAGES/DRESSINGS) ×2 IMPLANT
GAUZE SPONGE 4X4 12PLY STRL (GAUZE/BANDAGES/DRESSINGS) ×2 IMPLANT
GLOVE SURG POLY ORTHO LF SZ7.5 (GLOVE) ×2 IMPLANT
GOWN STRL REUS W/ TWL LRG LVL3 (GOWN DISPOSABLE) ×2 IMPLANT
GOWN STRL REUS W/TWL LRG LVL3 (GOWN DISPOSABLE) ×2
KIT TURNOVER KIT A (KITS) ×2 IMPLANT
MANIFOLD NEPTUNE II (INSTRUMENTS) ×2 IMPLANT
MAT PREVALON FULL STRYKER (MISCELLANEOUS) ×2 IMPLANT
NS IRRIG 1000ML POUR BTL (IV SOLUTION) ×2 IMPLANT
PACK C SECTION AR (MISCELLANEOUS) ×2 IMPLANT
PAD OB MATERNITY 4.3X12.25 (PERSONAL CARE ITEMS) ×2 IMPLANT
PAD PREP 24X41 OB/GYN DISP (PERSONAL CARE ITEMS) ×2 IMPLANT
PENCIL SMOKE EVACUATOR (MISCELLANEOUS) ×2 IMPLANT
RETRACTOR TRAXI PANNICULUS (MISCELLANEOUS) ×1 IMPLANT
RETRACTOR WND ALEXIS-O 25 LRG (MISCELLANEOUS) ×2 IMPLANT
RTRCTR WOUND ALEXIS O 25CM LRG (MISCELLANEOUS) ×4
SCRUB CHG 4% DYNA-HEX 4OZ (MISCELLANEOUS) ×1 IMPLANT
SPONGE T-LAP 18X18 ~~LOC~~+RFID (SPONGE) ×2 IMPLANT
SUT VIC AB 0 CTX 36 (SUTURE) ×2
SUT VIC AB 0 CTX36XBRD ANBCTRL (SUTURE) ×2 IMPLANT
SUT VIC AB 1 CT1 36 (SUTURE) ×4 IMPLANT
SUT VICRYL+ 3-0 36IN CT-1 (SUTURE) ×4 IMPLANT
TRAXI PANNICULUS RETRACTOR (MISCELLANEOUS) ×1

## 2020-07-19 NOTE — Anesthesia Procedure Notes (Signed)
Date/Time: 07/19/2020 8:00 AM Performed by: Allean Found, CRNA Pre-anesthesia Checklist: Patient identified, Emergency Drugs available, Suction available, Patient being monitored and Timeout performed Oxygen Delivery Method: Nasal cannula Placement Confirmation: positive ETCO2

## 2020-07-19 NOTE — Op Note (Signed)
      OP NOTE  Date: 07/19/2020   11:12 AM Name Joanna [name] MR# [id number]  Preoperative Diagnosis: 1. Intrauterine pregnancy at [redacted]w[redacted]d Active Problems:   Morbid obesity with BMI of 50.0-59.9, adult ([place])   History of macrosomia in infant in prior pregnancy, currently pregnant   LGA (large for gestational age) infant   Macrosomia affecting management of mother in third trimester   Desires Repeat CD   Postoperative Diagnosis: 1. Intrauterine pregnancy at [redacted]w[redacted]d, delivered 2. Viable infant 3. Remainder same as pre-op   Procedure: 1. Repeat Low-Transverse Cesarean Section  Surgeon: [name], MD  Assistant:  Marcelline Mates, MD   No other capable assistant was available for this surgery which requires an experienced, high level assistant.  She provided exposure, dissection, suctioning, retraction, and general support and assistance during the procedure.   Anesthesia: Spinal    EBL: 900 ml     Findings: 1) female infant, Apgar scores of 8   at 1 minute and 9   at 5 minutes and a birthweight of 149.21  ounces.    2) Normal uterus, tubes and ovaries.    Procedure:  The patient was prepped and draped in the supine position and placed under spinal anesthesia.  A transverse incision was made across the abdomen in a Pfannenstiel manner. If indicated the old scar was systematically removed with sharp dissection.  We carried the dissection down to the level of the fascia.  The fascia was incised in a curvilinear manner.  The fascia was then elevated from the rectus muscles with blunt and sharp dissection.  The rectus muscles were separated laterally exposing the peritoneum.  The peritoneum was carefully entered with care being taken to avoid bowel and bladder.  A self-retaining retractor was placed.  The visceral peritoneum was incised in a curvilinear fashion across the lower uterine segment creating a bladder flap. A transverse incision was made across the lower uterine segment and  extended laterally and superiorly using the bandage scissors.  Artificial rupture membranes was performed and Clear fluid was noted.  The infant was delivered from the cephalic position.  A nuchal cord was not present. After an appropriate time interval, the cord was doubly clamped and cut. Cord blood was obtained if required.  The infant was handed to the pediatric personnel  who then placed the infant under heat lamps where it was cleaned dried and suctioned as needed. The placenta was delivered. The hysterotomy incision was then identified on ring forceps.  The uterine cavity was cleaned with a moist lap sponge.  The hysterotomy incision was closed with a running interlocking suture of Vicryl.  Hemostasis was excellent.  Pitocin was run in the IV and the uterus was found to be firm. The posterior cul-de-sac and gutters were cleaned and inspected.  Hemostasis was noted.  The fascia was then closed with a running suture of #1 Vicryl.  Hemostasis of the subcutaneous tissues was obtained using the Bovie.  The subcutaneous tissues were closed with a running suture of 000 Vicryl.  A subcuticular suture was placed.  Dermabond was applied in the usual manner.  A pressure dressing was placed.  The patient went to the recovery room in stable condition.   [name], M.D. 07/19/2020 11:12 AM

## 2020-07-19 NOTE — TOC Initial Note (Signed)
Transition of Care [facility]) - Initial/Assessment Note    Patient Details  Name: Joanna [name] MRN: [id number] Date of Birth: 02-17-2000  Transition of Care [facility]) CM/SW Contact:    Kerin Salen, RN Phone Number: 07/19/2020, 3:44 PM  Clinical Narrative:   ewborn in the room with Mom, maternal grandmother at bedside. Mom and grandmother excited about baby, talking to him. Baby name, [place]. Mother also has a two year old daughter. Lives with mother and two brothers. Mother already signed up for [facility] and caseworker Rose. Mother and grandmother voices having everything needed for baby, who will receive health care at the [facility] clinic. Mother voices no problems with transportation will keep appointments as scheduled.  Grandmother left the room, I discussed the positive Marijuana test in March, patient states she was aware and has not used in over five months. Mother says she will not use anymore, will breast and bottle fed baby. Mother was informed that if the baby drug test is positive, a report to CPS will be done, patient voices understanding and says she doubt if it will be positive. Cell phone number [phone], can give to CPS worker if needed. Father of the baby is not staying in the home, however they are friends and he is supportive of both she and the baby.                               Expected Discharge Plan: Home/Self Care Barriers to Discharge: Continued Medical Work up   Patient Goals and CMS Choice Patient states their goals for this hospitalization and ongoing recovery are:: To return home lives with Mother and two brothers.      Expected Discharge Plan and Services Expected Discharge Plan: Home/Self Care In-house Referral: Clinical Social Work     Living arrangements for the past 2 months: Apartment                                      Prior Living Arrangements/Services Living arrangements for the past 2 months: Apartment Lives with::  Minor Children, Parents Patient language and need for interpreter reviewed:: Yes Do you feel safe going back to the place where you live?: Yes      Need for Family Participation in Patient Care: No (Comment) Care giver support system in place?: Yes (comment)   Criminal Activity/Legal Involvement Pertinent to Current Situation/Hospitalization: No - Comment as needed  Activities of Daily Living Home Assistive Devices/Equipment: None ADL Screening (condition at time of admission) Patient's cognitive ability adequate to safely complete daily activities?: No Is the patient deaf or have difficulty hearing?: Yes Does the patient have difficulty seeing, even when wearing glasses/contacts?: Yes Does the patient have difficulty concentrating, remembering, or making decisions?: Yes Patient able to express need for assistance with ADLs?: Yes Does the patient have difficulty dressing or bathing?: Yes Independently performs ADLs?: Yes (appropriate for developmental age) Does the patient have difficulty walking or climbing stairs?: No Weakness of Legs: None Weakness of Arms/Hands: None  Permission Sought/Granted                  Emotional Assessment Appearance:: Appears stated age Attitude/Demeanor/Rapport: Self-Confident, Engaged Affect (typically observed): Accepting Orientation: : Oriented to Self, Oriented to Place, Oriented to  Time, Oriented to Situation Alcohol / Substance Use: Other (comment) (Marijuana March 2022.) Psych  Involvement: No (comment)  Admission diagnosis:  Post-operative state [Z98.890] Patient Active Problem List   Diagnosis Date Noted   Macrosomia affecting management of mother in third trimester 07/19/2020   Post-operative state 07/19/2020   Desires VBAC (vaginal birth after cesarean) trial 06/21/2020   History of macrosomia in infant in prior pregnancy, currently pregnant 06/21/2020   LGA (large for gestational age) infant 06/21/2020   Personal history of  COVID-19 06/21/2020   COVID-19 affecting pregnancy in third trimester    H/O migraine during pregnancy 06/04/2020   Anemia of pregnancy in third trimester 05/06/2020   Decreased fetus movements affecting management of mother in second trimester    Postoperative wound infection    Sepsis ([place]) 02/03/2017   Labor and delivery indication for care or intervention 01/28/2017   Pregnant 12/24/2016   Positive urine drug screen 07/09/2016   Morbid obesity with BMI of 50.0-59.9, adult ([place]) 07/09/2016   High risk teen pregnancy 07/09/2016   Asthma 09/27/2012   PCP:  Center, [place]:   [id number] [address], [address] [place] [address] Phone: [phone] Fax: [phone]     Social Determinants of Health (SDOH) Interventions    Readmission Risk Interventions No flowsheet data found.

## 2020-07-19 NOTE — Lactation Note (Addendum)
This note was copied from a baby's chart. Lactation Consultation Note  Patient Name: Boy Elani Delph HFSFS'E Date: 07/19/2020 Reason for consult: Initial assessment, followup assessment Age:21 hours  Maternal Data Does the patient have breastfeeding experience prior to this delivery?: Yes How long did the patient breastfeed?: 2-3 mths  Feeding Mother's Current Feeding Choice: Breast Milk and Formula Nipple Type: Slow - flow Mom recently fed baby 30 ml formula, states she did this with her last child, informed mom that breastfeeding frequently helps her to produce more milk LATCH Score                    Lactation Tools Discussed/Used  [place] name and no written on white board  Interventions Interventions: Education (encouraged breastfeeding frequently to make milk)  Discharge Pump: Personal Mondamin Program: Yes  Consult Status Consult Status: PRN    Ferol Luz 07/19/2020, 7:12 PM

## 2020-07-19 NOTE — Anesthesia Preprocedure Evaluation (Addendum)
Anesthesia Evaluation  Patient identified by MRN, date of birth, ID band Patient awake  General Assessment Comment: Patient says after her previous cesarean, she had trouble walking for two months, had leg weakness, and had to go to physical therapy. She does note that she had a difficult epidural placement, saying she was stuck 5-6 times along her back. Everything resolved afterward  Reviewed: Allergy & Precautions, NPO status , Patient's Chart, lab work & pertinent test results  History of Anesthesia Complications Negative for: history of anesthetic complications  Airway Mallampati: II  TM Distance: >3 FB Neck ROM: Full    Dental no notable dental hx. (+) Teeth Intact   Pulmonary neg pulmonary ROS, neg sleep apnea, neg COPD, Patient abstained from smoking.Not current smoker,  Childhood asthma, none as an adult   Pulmonary exam normal breath sounds clear to auscultation       Cardiovascular Exercise Tolerance: Good METS(-) hypertension(-) CAD and (-) Past MI negative cardio ROS  (-) dysrhythmias  Rhythm:Regular Rate:Normal - Systolic murmurs    Neuro/Psych  Headaches, PSYCHIATRIC DISORDERS Anxiety Bipolar Disorder    GI/Hepatic GERD  ,(+)     (-) substance abuse  ,   Endo/Other  neg diabetesMorbid obesity  Renal/GU negative Renal ROS     Musculoskeletal   Abdominal (+) + obese,   Peds  Hematology  (+) anemia ,   Anesthesia Other Findings Past Medical History: No date: Anemia No date: Anxiety No date: Asthma     Comment:  h/o no problems in adulthood No date: Bipolar 1 disorder (HCC) No date: Complication of anesthesia     Comment:  "I could not walk without assistance for 2 months after               my epidural from my c-section" No date: GERD (gastroesophageal reflux disease) No date: Headache     Comment:  Migraines No date: History of kidney stones 02/2017: Thrombophlebitis     Comment:  septic  pelvic venous thrombophlebitis  Reproductive/Obstetrics (+) Pregnancy                             Anesthesia Physical Anesthesia Plan  ASA: 3  Anesthesia Plan: Spinal   Post-op Pain Management:    Induction:   PONV Risk Score and Plan: 3 and Ondansetron and Dexamethasone  Airway Management Planned: Natural Airway  Additional Equipment:   Intra-op Plan:   Post-operative Plan:   Informed Consent: I have reviewed the patients History and Physical, chart, labs and discussed the procedure including the risks, benefits and alternatives for the proposed anesthesia with the patient or authorized representative who has indicated his/her understanding and acceptance.       Plan Discussed with: CRNA and Surgeon  Anesthesia Plan Comments: (Discussed R/B/A of neuraxial anesthesia technique with patient: - rare risks of spinal/epidural hematoma, nerve damage, infection - Risk of PDPH - Risk of itching - Risk of nausea and vomiting - Risk of conversion to general anesthesia and its associated risks, including sore throat, damage to lips/teeth/oropharynx, and rare risks such as cardiac and respiratory events. - Risk of surgical bleeding requiring blood products Discussed her concerns regarding neuraxial anesthesia given prior history of 2 months of difficulty walking after prior cesarean/epidural. I told her that a spinal needle is a lot thinner than epidural needle, which should hopefully make placement easier and less traumatic. Told her the other option is GETA with its associated risks, and  she said she'd prefer not to have that. She is understanding and agrees to spinal.)        Anesthesia Quick Evaluation

## 2020-07-19 NOTE — Anesthesia Procedure Notes (Signed)
Spinal  Patient location during procedure: OR Start time: 07/19/2020 7:55 AM End time: 07/19/2020 7:58 AM Reason for block: surgical anesthesia Staffing Performed: anesthesiologist  Anesthesiologist: Arita Miss, MD Preanesthetic Checklist Completed: patient identified, IV checked, site marked, risks and benefits discussed, surgical consent, monitors and equipment checked, pre-op evaluation and timeout performed Spinal Block Patient position: sitting Prep: ChloraPrep Patient monitoring: heart rate, cardiac monitor, continuous pulse ox and blood pressure Approach: midline Location: L3-4 Injection technique: single-shot Needle Needle type: Whitacre and Introducer  Needle gauge: 24 G Needle length: 9 cm Assessment Sensory level: T4 Events: CSF return Additional Notes Easy spinal placement. Negative paresthesia. Negative blood return. Positive free-flowing CSF. Expiration date of kit checked and confirmed. Patient tolerated procedure well, without complications.

## 2020-07-19 NOTE — Transfer of Care (Signed)
Immediate Anesthesia Transfer of Care Note  Patient: Joanna [name]  Procedure(s) Performed: CESAREAN SECTION REPEAT  Patient Location: PACU  Anesthesia Type:Spinal  Level of Consciousness: awake, alert  and oriented  Airway & Oxygen Therapy: Patient Spontanous Breathing and Patient connected to nasal cannula oxygen  Post-op Assessment: Report given to RN and Post -op Vital signs reviewed and stable  Post vital signs: Reviewed and stable  Last Vitals:  Vitals Value Taken Time  BP 93/51 07/19/20 0937  Temp 36.7 C 07/19/20 0936  Pulse    Resp 14 07/19/20 0940  SpO2    Vitals shown include unvalidated device data.  Last Pain:  Vitals:   07/19/20 0936  TempSrc: Oral  PainSc:          Complications: No notable events documented.

## 2020-07-20 NOTE — Progress Notes (Signed)
Progress Note - Cesarean Delivery  Joanna [name] is a 20 y.o. [phone] now PP day 1 s/p C-Section, Low Vertical .   Subjective:  Patient reports no problems with eating, bowel movements, voiding, or their wound  Pain controlled  Objective:  Vital signs in last 24 hours: Temp:  [97.8 F (36.6 C)-98.6 F (37 C)] 98.3 F (36.8 C) (07/09 0715) Pulse Rate:  [57-83] 66 (07/09 0715) Resp:  [13-22] 18 (07/09 0715) BP: (108-140)/(53-67) 108/67 (07/09 0715) SpO2:  [96 %-100 %] 100 % (07/09 0715)  Physical Exam:  General: alert, cooperative, appears stated age, and no distress Lochia: appropriate Uterine Fundus: firm Incision: Dressing intact - dry    Data Review Recent Labs    07/17/20 1201  HGB 9.0*  HCT 27.9*    Assessment:  Active Problems:   Morbid obesity with BMI of 50.0-59.9, adult ([place])   History of macrosomia in infant in prior pregnancy, currently pregnant   LGA (large for gestational age) infant   Macrosomia affecting management of mother in third trimester   Post-operative state   Status post Cesarean section. Doing well postoperatively.     Plan:       Continue current care.    [name], M.D. 07/20/2020 10:45 AM

## 2020-07-20 NOTE — H&P (Signed)
History and Physical   HPI  Joanna [name] is a 20 y.o. [id number] at [redacted]w[redacted]d Estimated Date of Delivery: 07/24/20 who is being admitted for C-section. Pt desires repeat.  Suspected LGA.  H/O Macrosomia and previous CPD.    OB History  OB History  Gravida Para Term Preterm AB Living  2 2 2  0 0 2  SAB IAB Ectopic Multiple Live Births  0 0 0 0 2    # Outcome Date GA Lbr Len/2nd Weight Sex Delivery Anes PTL Lv  2 Term 07/19/20 [redacted]w[redacted]d  4230 g M CS-LVertical Spinal  LIV     Name: Joanna [name],PENDINGBABY     Apgar1: 8  Apgar5: 9  1 Term 01/28/17 [redacted]w[redacted]d  73 g F CS-LTranv Spinal, EPI  LIV     Birth Comments: female     Name: Joanna [name],GIRL [name]     Apgar1: 8  Apgar5: 9    PROBLEM LIST  Pregnancy complications or risks: Patient Active Problem List   Diagnosis Date Noted   Macrosomia affecting management of mother in third trimester 07/19/2020   Post-operative state 07/19/2020   Desires VBAC (vaginal birth after cesarean) trial 06/21/2020   History of macrosomia in infant in prior pregnancy, currently pregnant 06/21/2020   LGA (large for gestational age) infant 06/21/2020   Personal history of COVID-19 06/21/2020   COVID-19 affecting pregnancy in third trimester    H/O migraine during pregnancy 06/04/2020   Anemia of pregnancy in third trimester 05/06/2020   Decreased fetus movements affecting management of mother in second trimester    Postoperative wound infection    Sepsis ([place]) 02/03/2017   Labor and delivery indication for care or intervention 01/28/2017   Pregnant 12/24/2016   Positive urine drug screen 07/09/2016   Morbid obesity with BMI of 50.0-59.9, adult ([place]) 07/09/2016   High risk teen pregnancy 07/09/2016   Asthma 09/27/2012    Prenatal labs and studies: ABO, Rh: --/--/B POS (07/06 1201) Antibody: NEG (07/06 1201) Rubella: 5.69 (12/23 1024) RPR: Non Reactive (04/21 1016)  HBsAg: Negative (12/23 1024)  HIV: Non Reactive (12/23 1024)  GBS:    Past  Medical History:  Diagnosis Date   Anemia    Anxiety    Asthma    h/o no problems in adulthood   Bipolar 1 disorder ([place])    Complication of anesthesia    "I could not walk without assistance for 2 months after my epidural from my c-section"   GERD (gastroesophageal reflux disease)    Headache    Migraines   History of kidney stones    Thrombophlebitis 02/2017   septic pelvic venous thrombophlebitis     Past Surgical History:  Procedure Laterality Date   CESAREAN SECTION N/A 01/28/2017   Procedure: CESAREAN SECTION;  Surgeon: Harlin Heys, MD;  Location: [facility] ORS;  Service: Obstetrics;  Laterality: N/A;   CESAREAN SECTION N/A 07/19/2020   Procedure: CESAREAN SECTION REPEAT;  Surgeon: Harlin Heys, MD;  Location: [facility] ORS;  Service: Obstetrics;  Laterality: N/A;   CYSTOSCOPY W/ RETROGRADES Right 03/26/2017   Procedure: CYSTOSCOPY WITH RETROGRADE PYELOGRAM;  Surgeon: Abbie Sons, MD;  Location: [facility] ORS;  Service: Urology;  Laterality: Right;   CYSTOSCOPY W/ URETERAL STENT PLACEMENT Right 02/06/2017   Procedure: CYSTOSCOPY WITH RETROGRADE PYELOGRAM/URETERAL STENT PLACEMENT;  Surgeon: Abbie Sons, MD;  Location: [facility] ORS;  Service: Urology;  Laterality: Right;   CYSTOSCOPY W/ URETERAL STENT REMOVAL Right 03/26/2017   Procedure: CYSTOSCOPY WITH STENT REMOVAL;  Surgeon: Abbie Sons, MD;  Location: [facility] ORS;  Service: Urology;  Laterality: Right;   TONSILLECTOMY       Medications    Current Discharge Medication List     CONTINUE these medications which have NOT CHANGED   Details  ferrous sulfate (FERROUSUL) 325 (65 FE) MG tablet Take 1 tablet (325 mg total) by mouth daily with breakfast. Qty: 60 tablet, Refills: 2    Prenatal Vit-Fe Fumarate-FA (PRENATAL VITAMIN PO) Take by mouth in the morning.    pantoprazole (PROTONIX) 40 MG tablet Take 1 tablet (40 mg total) by mouth 2 (two) times daily before a meal. Qty: 60 tablet, Refills: 3          Allergies  Red dye  Review of Systems  Pertinent items noted in HPI and remainder of comprehensive ROS otherwise negative.  Physical Exam  BP 108/67 (BP Location: Right Arm)   Pulse 66   Temp 98.3 F (36.8 C) (Oral)   Resp 18   Ht 5\' 9"  (1.753 m)   Wt (!) 163 kg   LMP 09/30/2019 (Approximate)   SpO2 100%   Breastfeeding Unknown   BMI 53.07 kg/m   Lungs:  CTA B Cardio: RRR without M/R/G Abd: Soft, gravid, NT Presentation: cephalic EXT: No C/C/ 1+ Edema DTRs: 2+ B CERVIX:    See Prenatal records for more detailed PE.     FHR:  Normal FHT's  Toco: Uterine Contractions: None  Test Results  No results found for this or any previous visit (from the past 24 hour(s)).   Assessment   G2P2002 at [redacted]w[redacted]d Estimated Date of Delivery: 07/24/20  The fetus is reassuring.   Patient Active Problem List   Diagnosis Date Noted   Macrosomia affecting management of mother in third trimester 07/19/2020   Post-operative state 07/19/2020   Desires VBAC (vaginal birth after cesarean) trial 06/21/2020   History of macrosomia in infant in prior pregnancy, currently pregnant 06/21/2020   LGA (large for gestational age) infant 06/21/2020   Personal history of COVID-19 06/21/2020   COVID-19 affecting pregnancy in third trimester    H/O migraine during pregnancy 06/04/2020   Anemia of pregnancy in third trimester 05/06/2020   Decreased fetus movements affecting management of mother in second trimester    Postoperative wound infection    Sepsis ([place]) 02/03/2017   Labor and delivery indication for care or intervention 01/28/2017   Pregnant 12/24/2016   Positive urine drug screen 07/09/2016   Morbid obesity with BMI of 50.0-59.9, adult ([place]) 07/09/2016   High risk teen pregnancy 07/09/2016   Asthma 09/27/2012    Plan  1. Admit to L&D :   2. EFM: -- Category 1 3. Spinal Anes 4. Admission labs  5. Repeat CD  Finis Joanna [name], M.D. 07/20/2020 10:41 AM

## 2020-07-20 NOTE — Anesthesia Postprocedure Evaluation (Signed)
Anesthesia Post Note  Patient: Joanna [name]  Procedure(s) Performed: CESAREAN SECTION REPEAT  Patient location during evaluation: Mother Baby Anesthesia Type: Spinal Level of consciousness: awake, oriented, awake and alert and patient cooperative Pain management: pain level controlled Vital Signs Assessment: post-procedure vital signs reviewed and stable Respiratory status: spontaneous breathing and nonlabored ventilation Cardiovascular status: blood pressure returned to baseline and stable Postop Assessment: no headache, no backache, no apparent nausea or vomiting and adequate PO intake Anesthetic complications: no   No notable events documented.   Last Vitals:  Vitals:   07/20/20 1522 07/20/20 1523  BP: (!) 135/50 125/63  Pulse: 72   Resp: 18   Temp: 36.8 C   SpO2: 100%     Last Pain:  Vitals:   07/20/20 1522  TempSrc: Oral  PainSc:                  Deno Etienne

## 2020-07-21 ENCOUNTER — Ambulatory Visit: Payer: Self-pay

## 2020-07-21 MED ORDER — IBUPROFEN 600 MG PO TABS
600.0000 mg | ORAL_TABLET | Freq: Four times a day (QID) | ORAL | 0 refills | Status: DC
Start: 2020-07-21 — End: 2020-11-21

## 2020-07-21 NOTE — Progress Notes (Signed)
Pt discharged with infant. Discharge instructions, prescriptions, and follow up appointments given to and reviewed with patient. Pt verbalized understanding. Escorted out by auxillary.  

## 2020-07-21 NOTE — Lactation Note (Signed)
This note was copied from a baby's chart. Lactation Consultation Note  Patient Name: Joanna [name] [id number] Date: 07/21/2020 Reason for consult: Follow-up assessment Age:21 hours Lactation Rounds: LC to the room for a visit. Mother is resting in her bed, baby is asleep in bassinet. Mother states feeds are going well. LC reviewed and encouraged feeding on demand and with cues. If baby is not cueing we encourage hand expression. Reviewed diaper counts for days of life and when to call Peds with questions. Reviewed "understanding Postpartum and Newborn care " booklet at bedside. Reviewed outpatient Lactation number and resources.  Reviewed pacifier, pumping, and bottles are not encouraged until breastfeeding is established and going well in the first 4 weeks, but if giving formula bottles often would encourage pumping to start sooner. Reviewed supply and demand. Mother stated understanding with all teaching.   Maternal Data Has patient been taught Hand Expression?: Yes Does the patient have breastfeeding experience prior to this delivery?: Yes How long did the patient breastfeed?: 2-3 mo  Feeding Mother's Current Feeding Choice: Breast Milk and Formula  Interventions Interventions: Breast feeding basics reviewed;Breast massage;Hand express;Position options;Education  Discharge Discharge Education: Engorgement and breast care;Warning signs for feeding baby Pump: Personal (Has a medline electric pump) WIC Program: Yes  Consult Status Consult Status: PRN Follow-up type: Call as needed    Ahliya Glatt D Tanita Palinkas 07/21/2020, 12:54 PM

## 2020-07-21 NOTE — Discharge Summary (Signed)
Physician Obstetric Discharge Summary  Patient Name: Joanna [name] DOB: 2000/04/10 MRN: [id number]                            Discharge Summary  Date of Admission: 07/19/2020 Date of Discharge: 07/21/2020 Delivering Provider: Harlin Heys   Admitting Diagnosis: Post-operative state [Z98.890] at [redacted]w[redacted]d Secondary diagnosis:  Active Problems:   Morbid obesity with BMI of 50.0-59.9, adult ([place])   History of macrosomia in infant in prior pregnancy, currently pregnant   LGA (large for gestational age) infant   Macrosomia affecting management of mother in third trimester   Post-operative state   Mode of Delivery:       low uterine, transverse     Discharge diagnosis: Term Pregnancy Delivered      Post partum procedures:   Complications: none                     Discharge Day SOAP Note:  Subjective:  The patient has no complaints.  She is ambulating well. She is taking PO well. Pain is well controlled with current medications. Patient is urinating without difficulty.   She is passing flatus.  Desires D/C  Objective  Vital signs: BP 134/75 (BP Location: Left Arm)   Pulse (!) 59   Temp 98.4 F (36.9 C) (Oral)   Resp 18   Ht 5\' 9"  (1.753 m)   Wt (!) 163 kg   LMP 09/30/2019 (Approximate)   SpO2 99%   Breastfeeding Unknown   BMI 53.07 kg/m   Physical Exam: Gen: NAD Abdomen:  clean, dry, no drainage, healing Fundus Fundal Tone: Firm  Lochia Amount: Scant     Data Review Labs: Lab Results  Component Value Date   WBC 7.6 07/17/2020   HGB 9.0 (L) 07/17/2020   HCT 27.9 (L) 07/17/2020   MCV 82.1 07/17/2020   PLT 188 07/17/2020   CBC Latest Ref Rng & Units 07/17/2020 06/04/2020 05/02/2020  WBC 4.0 - 10.5 K/uL 7.6 5.3 7.2  Hemoglobin 12.0 - 15.0 g/dL 9.0(L) 8.9(L) 10.0(L)  Hematocrit 36.0 - 46.0 % 27.9(L) 27.3(L) 30.9(L)  Platelets 150 - 400 K/uL 188 173 225   B POS  Edinburgh Score: Edinburgh Postnatal Depression Scale Screening Tool 07/19/2020  I  have been able to laugh and see the funny side of things. 0  I have looked forward with enjoyment to things. 0  I have blamed myself unnecessarily when things went wrong. 1  I have been anxious or worried for no good reason. 1  I have felt scared or panicky for no good reason. 0  Things have been getting on top of me. 1  I have been so unhappy that I have had difficulty sleeping. 1  I have felt sad or miserable. 0  I have been so unhappy that I have been crying. 1  The thought of harming myself has occurred to me. 0  Edinburgh Postnatal Depression Scale Total 5    Assessment:  Active Problems:   Morbid obesity with BMI of 50.0-59.9, adult ([place])   History of macrosomia in infant in prior pregnancy, currently pregnant   LGA (large for gestational age) infant   Macrosomia affecting management of mother in third trimester   Post-operative state   Doing well.  Normal progress as expected.  Plan:  Discharge to home  Modified rest as directed - may slowly resume normal activities with restrictions  as discussed.  Medications as written.  See below for additional.      Discharge Instructions: Per After Visit Summary. Activity: Advance as tolerated. Pelvic rest for 6 weeks.  Also refer to After Visit Summary.  Wound care discussed. Diet: Regular Medications: Allergies as of 07/21/2020       Reactions   Red Dye Swelling, Rash        Medication List     STOP taking these medications    ferrous sulfate 325 (65 FE) MG tablet Commonly known as: FerrouSul   pantoprazole 40 MG tablet Commonly known as: Protonix       TAKE these medications    ibuprofen 600 MG tablet Commonly known as: ADVIL Take 1 tablet (600 mg total) by mouth every 6 (six) hours.   PRENATAL VITAMIN PO Take by mouth in the morning.               Discharge Care Instructions  (From admission, onward)           Start     Ordered   07/21/20 0000  No dressing needed       Comments: Keep  wound area clean and dry   07/21/20 1007           Outpatient follow up:   Follow-up Information     Harlin Heys, MD Follow up in 1 week(s).   Specialties: Obstetrics and Gynecology, Radiology Contact information: [address] [place] [address] [phone]                Postpartum contraception: Will discuss at first post-partum visit.  Discharged Condition: good  Discharged to: home  Newborn Data: Disposition:home with mother  Apgars: APGAR (1 MIN): 8   APGAR (5 MINS): 9   APGAR (10 MINS):    Baby Feeding: Bottle  [name], M.D. 07/21/2020 10:07 AM

## 2020-07-25 ENCOUNTER — Ambulatory Visit (INDEPENDENT_AMBULATORY_CARE_PROVIDER_SITE_OTHER): Payer: Medicaid Other | Admitting: Obstetrics and Gynecology

## 2020-07-25 ENCOUNTER — Encounter: Payer: Self-pay | Admitting: Obstetrics and Gynecology

## 2020-07-25 ENCOUNTER — Other Ambulatory Visit: Payer: Self-pay

## 2020-07-25 VITALS — BP 124/77 | HR 69 | Wt 338.0 lb

## 2020-07-25 DIAGNOSIS — Z9889 Other specified postprocedural states: Secondary | ICD-10-CM

## 2020-07-25 NOTE — Progress Notes (Signed)
HPI:      Joanna [name] is a 20 y.o. [phone] who LMP was No LMP recorded.  Subjective:   She presents today approximately 1 week from cesarean delivery.  She reports she is doing well.  Not having pain.  Ambulating voiding and having bowel movements without issue.  She continues to breast-feed and says that is going well for her.    Hx: The following portions of the patient's history were reviewed and updated as appropriate:             She  has a past medical history of Anemia, Anxiety, Asthma, Bipolar 1 disorder ([place]), Complication of anesthesia, GERD (gastroesophageal reflux disease), Headache, History of kidney stones, and Thrombophlebitis (02/2017). She does not have any pertinent problems on file. She  has a past surgical history that includes Tonsillectomy; Cesarean section (N/A, 01/28/2017); Cystoscopy w/ ureteral stent placement (Right, 02/06/2017); Cystoscopy w/ ureteral stent removal (Right, 03/26/2017); Cystoscopy w/ retrogrades (Right, 03/26/2017); and Cesarean section (N/A, 07/19/2020). Her family history includes Asthma in her brother; Breast cancer in her paternal grandmother; Cancer in her maternal aunt; Chronic Renal Failure in her maternal grandfather; Diabetes in her maternal grandfather, mother, and paternal grandfather; Heart Problems in her brother and maternal grandfather; Hyperlipidemia in her maternal grandfather; Hypertension in her maternal grandfather and mother; Stroke in her maternal grandmother; Thyroid disease in her maternal grandmother. She  reports that she has never smoked. She has never used smokeless tobacco. She reports previous drug use. Drug: Marijuana. She reports that she does not drink alcohol. She has a current medication list which includes the following prescription(s): ibuprofen and prenatal vit-fe fumarate-fa. She is allergic to red dye.       Review of Systems:  Review of Systems  Constitutional: Denied constitutional symptoms, night sweats,  recent illness, fatigue, fever, insomnia and weight loss.  Eyes: Denied eye symptoms, eye pain, photophobia, vision change and visual disturbance.  Ears/Nose/Throat/Neck: Denied ear, nose, throat or neck symptoms, hearing loss, nasal discharge, sinus congestion and sore throat.  Cardiovascular: Denied cardiovascular symptoms, arrhythmia, chest pain/pressure, edema, exercise intolerance, orthopnea and palpitations.  Respiratory: Denied pulmonary symptoms, asthma, pleuritic pain, productive sputum, cough, dyspnea and wheezing.  Gastrointestinal: Denied, gastro-esophageal reflux, melena, nausea and vomiting.  Genitourinary: Denied genitourinary symptoms including symptomatic vaginal discharge, pelvic relaxation issues, and urinary complaints.  Musculoskeletal: Denied musculoskeletal symptoms, stiffness, swelling, muscle weakness and myalgia.  Dermatologic: Denied dermatology symptoms, rash and scar.  Neurologic: Denied neurology symptoms, dizziness, headache, neck pain and syncope.  Psychiatric: Denied psychiatric symptoms, anxiety and depression.  Endocrine: Denied endocrine symptoms including hot flashes and night sweats.   Meds:   Current Outpatient Medications on File Prior to Visit  Medication Sig Dispense Refill   ibuprofen (ADVIL) 600 MG tablet Take 1 tablet (600 mg total) by mouth every 6 (six) hours. 30 tablet 0   Prenatal Vit-Fe Fumarate-FA (PRENATAL VITAMIN PO) Take by mouth in the morning.     No current facility-administered medications on file prior to visit.          Objective:     Vitals:   07/25/20 0927  BP: 124/77  Pulse: 69   Filed Weights   07/25/20 0927  Weight: (!) 338 lb (153.3 kg)               Abdomen: Soft.  Non-tender.  No masses.  No HSM.  Incision/s: Intact.  Healing well.  No erythema.  No drainage.     Assessment:  [id number] Patient Active Problem List   Diagnosis Date Noted   Macrosomia affecting management of mother in third trimester  07/19/2020   Post-operative state 07/19/2020   Desires VBAC (vaginal birth after cesarean) trial 06/21/2020   History of macrosomia in infant in prior pregnancy, currently pregnant 06/21/2020   LGA (large for gestational age) infant 06/21/2020   Personal history of COVID-19 06/21/2020   COVID-19 affecting pregnancy in third trimester    H/O migraine during pregnancy 06/04/2020   Anemia of pregnancy in third trimester 05/06/2020   Decreased fetus movements affecting management of mother in second trimester    Postoperative wound infection    Sepsis ([place]) 02/03/2017   Labor and delivery indication for care or intervention 01/28/2017   Pregnant 12/24/2016   Positive urine drug screen 07/09/2016   Morbid obesity with BMI of 50.0-59.9, adult ([place]) 07/09/2016   High risk teen pregnancy 07/09/2016   Asthma 09/27/2012     1. Post-operative state     Excellent recovery-patient doing very well   Plan:            1.  Wound care discussed.  2.  To discuss birth control at next visit-previously discussed IUD and patient seems interested. Orders No orders of the defined types were placed in this encounter.   No orders of the defined types were placed in this encounter.     F/U  Return in about 4 weeks (around 08/22/2020).  [name], M.D. 07/25/2020 9:43 AM

## 2020-08-22 ENCOUNTER — Encounter: Payer: Medicaid Other | Admitting: Obstetrics and Gynecology

## 2020-08-22 ENCOUNTER — Encounter: Payer: Self-pay | Admitting: Obstetrics and Gynecology

## 2020-09-10 ENCOUNTER — Encounter: Payer: Medicaid Other | Admitting: Obstetrics and Gynecology

## 2020-10-08 ENCOUNTER — Other Ambulatory Visit: Payer: Self-pay

## 2020-10-08 DIAGNOSIS — Z79899 Other long term (current) drug therapy: Secondary | ICD-10-CM | POA: Diagnosis not present

## 2020-10-08 DIAGNOSIS — J45909 Unspecified asthma, uncomplicated: Secondary | ICD-10-CM | POA: Insufficient documentation

## 2020-10-08 DIAGNOSIS — N921 Excessive and frequent menstruation with irregular cycle: Secondary | ICD-10-CM | POA: Insufficient documentation

## 2020-10-08 DIAGNOSIS — R102 Pelvic and perineal pain: Secondary | ICD-10-CM | POA: Diagnosis not present

## 2020-10-08 DIAGNOSIS — N939 Abnormal uterine and vaginal bleeding, unspecified: Secondary | ICD-10-CM | POA: Diagnosis present

## 2020-10-08 LAB — CBC WITH DIFFERENTIAL/PLATELET
Abs Immature Granulocytes: 0.03 10*3/uL (ref 0.00–0.07)
Basophils Absolute: 0 10*3/uL (ref 0.0–0.1)
Basophils Relative: 0 %
Eosinophils Absolute: 0.1 10*3/uL (ref 0.0–0.5)
Eosinophils Relative: 1 %
HCT: 33.8 % — ABNORMAL LOW (ref 36.0–46.0)
Hemoglobin: 10.6 g/dL — ABNORMAL LOW (ref 12.0–15.0)
Immature Granulocytes: 0 %
Lymphocytes Relative: 13 %
Lymphs Abs: 1.3 10*3/uL (ref 0.7–4.0)
MCH: 25 pg — ABNORMAL LOW (ref 26.0–34.0)
MCHC: 31.4 g/dL (ref 30.0–36.0)
MCV: 79.7 fL — ABNORMAL LOW (ref 80.0–100.0)
Monocytes Absolute: 0.5 10*3/uL (ref 0.1–1.0)
Monocytes Relative: 5 %
Neutro Abs: 8.1 10*3/uL — ABNORMAL HIGH (ref 1.7–7.7)
Neutrophils Relative %: 81 %
Platelets: 291 10*3/uL (ref 150–400)
RBC: 4.24 MIL/uL (ref 3.87–5.11)
RDW: 17.3 % — ABNORMAL HIGH (ref 11.5–15.5)
WBC: 10.1 10*3/uL (ref 4.0–10.5)
nRBC: 0 % (ref 0.0–0.2)

## 2020-10-08 LAB — URINALYSIS, COMPLETE (UACMP) WITH MICROSCOPIC
Bacteria, UA: NONE SEEN
Bilirubin Urine: NEGATIVE
Glucose, UA: NEGATIVE mg/dL
Ketones, ur: NEGATIVE mg/dL
Nitrite: NEGATIVE
Protein, ur: NEGATIVE mg/dL
Specific Gravity, Urine: 1.018 (ref 1.005–1.030)
WBC, UA: NONE SEEN WBC/hpf (ref 0–5)
pH: 6 (ref 5.0–8.0)

## 2020-10-08 LAB — COMPREHENSIVE METABOLIC PANEL
ALT: 26 U/L (ref 0–44)
AST: 22 U/L (ref 15–41)
Albumin: 4 g/dL (ref 3.5–5.0)
Alkaline Phosphatase: 70 U/L (ref 38–126)
Anion gap: 8 (ref 5–15)
BUN: 15 mg/dL (ref 6–20)
CO2: 25 mmol/L (ref 22–32)
Calcium: 9.2 mg/dL (ref 8.9–10.3)
Chloride: 103 mmol/L (ref 98–111)
Creatinine, Ser: 0.8 mg/dL (ref 0.44–1.00)
GFR, Estimated: >60 mL/min (ref >60)
Glucose, Bld: 114 mg/dL — ABNORMAL HIGH (ref 70–99)
Potassium: 3.7 mmol/L (ref 3.5–5.1)
Sodium: 136 mmol/L (ref 135–145)
Total Bilirubin: 1 mg/dL (ref 0.3–1.2)
Total Protein: 7.5 g/dL (ref 6.5–8.1)

## 2020-10-08 LAB — HCG, QUANTITATIVE, PREGNANCY: hCG, Beta Chain, Quant, S: 1 m[IU]/mL (ref <5)

## 2020-10-08 LAB — POC URINE PREG, ED: Preg Test, Ur: NEGATIVE

## 2020-10-08 NOTE — ED Provider Notes (Signed)
Emergency Medicine Provider Triage Evaluation Note  Joanna [name] , a 20 y.o. female  was evaluated in triage.  Pt complains of vaginal bleeding for the past 2 days.  Patient reports that vaginal bleeding became heavy today with clots.  Patient states that this is only the second menstrual cycle she has had since having her baby.  She is complaining of pelvic pain and nausea at home.  She is unsure of the possibility of pregnancy.  Review of Systems  Positive: Patient has nausea and vaginal bleeding. Negative: No chest pain, chest tightness or abdominal pain.  Physical Exam  BP 120/70 (BP Location: Left Arm)   Pulse 78   Temp 99 F (37.2 C) (Oral)   Resp 18   Ht 5\' 9"  (1.753 m)   Wt (!) 141.8 kg   LMP 09/30/2020   SpO2 100%   Breastfeeding No   BMI 46.18 kg/m  Gen:   Awake, no distress   Resp:  Normal effort  MSK:   Moves extremities without difficulty  Other:    Medical Decision Making  Medically screening exam initiated at 8:05 PM.  Appropriate orders placed.  Joanna [name] was informed that the remainder of the evaluation will be completed by another provider, this initial triage assessment does not replace that evaluation, and the importance of remaining in the ED until their evaluation is complete.  Assessment and plan Vaginal bleeding 20 year old female presents to the emergency department with vaginal bleeding for the past 2 days and pelvic pain  Will obtain basic labs including beta hCG.  If beta-hCG does not indicate pregnancy, will obtain dedicated pelvic ultrasound and will reassess.   Vallarie Mare [place], Hershal Coria 10/08/20 Ernst Spell, MD 10/08/20 [phone]

## 2020-10-08 NOTE — ED Triage Notes (Signed)
Arrives pov complaining of vaginal bleeding by 2 days. Started with spotting, today has changed pad 5 times with heavy blood and clots. Bilat pelvic pain. Had c-section 2 mos ago. Denies any discharge or different smell. 10/10 pain.

## 2020-10-09 ENCOUNTER — Emergency Department
Admission: EM | Admit: 2020-10-09 | Discharge: 2020-10-09 | Disposition: A | Discharge status: Home / Self Care | Payer: Medicaid Other | Attending: Emergency Medicine | Admitting: Emergency Medicine

## 2020-10-09 DIAGNOSIS — N921 Excessive and frequent menstruation with irregular cycle: Secondary | ICD-10-CM

## 2020-10-09 MED ORDER — NORGESTIMATE-ETH ESTRADIOL 0.25-35 MG-MCG PO TABS: 1.0000 | ORAL_TABLET | Freq: Every day | ORAL | 0 refills | Status: DC

## 2020-10-09 NOTE — Discharge Instructions (Signed)
As we discussed, although you are having heavy vaginal bleeding, it is not dangerous at this time.  The plan at this time is for you to start taking the birth control pills prescribed as written and to follow-up with the GYN doctor indicated in this document.  Please return to the emergency department if you develop any new or worsening symptoms that concern you.  Remember that tobacco use greatly increases your risk of developing blood clots while taking birth control pills and avoid smoking or using any other tobacco products.

## 2020-10-09 NOTE — ED Provider Notes (Signed)
[facility] Emergency Department Provider Note  ____________________________________________   Event Date/Time   First MD Initiated Contact with Patient 10/09/20 0254     (approximate)  I have reviewed the triage vital signs and the nursing notes.   HISTORY  Chief Complaint Vaginal Bleeding (Pov, vaginal bleeding and pelvic cramping.)    HPI Joanna [name] is a 20 y.o. female  G2P2002 who gave birth by cesarean section about 2 months ago.  [facility] Am[facility] Hailey with [facility] is [facility].  She presents for evaluation of heavy vaginal bleeding with clots for the last 2 days.  She has changed her pad 5 times over the course of the day.  She just completed her period about a week ago and this is abnormal for her in general, although she has not had regular menstrual periods since her pregnancy.  She is also having generalized cramping and dull abdominal pain.  She denies fever/chills, sore throat, chest pain, shortness of breath, nausea, vomiting, and dysuria.  Nothing in particular seems to make the symptoms better or worse.  She was concerned because she had just recently had a period and now the bleeding is heavier and more uncomfortable than usual.  She does not smoke tobacco.  She is not on any birth control at this time.     Past Medical History:  Diagnosis Date   Anemia    Anxiety    Asthma    h/o no problems in adulthood   Bipolar 1 disorder ([place])    Complication of anesthesia    "I could not walk without assistance for 2 months after my epidural from my c-section"   GERD (gastroesophageal reflux disease)    Headache    Migraines   History of kidney stones    Thrombophlebitis 02/2017   septic pelvic venous thrombophlebitis    Patient Active Problem List   Diagnosis Date Noted   Macrosomia affecting management of mother in third trimester 07/19/2020   Post-operative state 07/19/2020   Desires VBAC (vaginal birth after cesarean) trial 06/21/2020    History of macrosomia in infant in prior pregnancy, currently pregnant 06/21/2020   LGA (large for gestational age) infant 06/21/2020   Personal history of COVID-19 06/21/2020   COVID-19 affecting pregnancy in third trimester    H/O migraine during pregnancy 06/04/2020   Anemia of pregnancy in third trimester 05/06/2020   Decreased fetus movements affecting management of mother in second trimester    Postoperative wound infection    Sepsis ([place]) 02/03/2017   Labor and delivery indication for care or intervention 01/28/2017   Pregnant 12/24/2016   Positive urine drug screen 07/09/2016   Morbid obesity with BMI of 50.0-59.9, adult ([place]) 07/09/2016   High risk teen pregnancy 07/09/2016   Asthma 09/27/2012    Past Surgical History:  Procedure Laterality Date   CESAREAN SECTION N/A 01/28/2017   Procedure: CESAREAN SECTION;  Surgeon: Harlin Heys, MD;  Location: [facility] ORS;  Service: Obstetrics;  Laterality: N/A;   CESAREAN SECTION N/A 07/19/2020   Procedure: CESAREAN SECTION REPEAT;  Surgeon: Harlin Heys, MD;  Location: [facility] ORS;  Service: Obstetrics;  Laterality: N/A;   CYSTOSCOPY W/ RETROGRADES Right 03/26/2017   Procedure: CYSTOSCOPY WITH RETROGRADE PYELOGRAM;  Surgeon: Abbie Sons, MD;  Location: [facility] ORS;  Service: Urology;  Laterality: Right;   CYSTOSCOPY W/ URETERAL STENT PLACEMENT Right 02/06/2017   Procedure: CYSTOSCOPY WITH RETROGRADE PYELOGRAM/URETERAL STENT PLACEMENT;  Surgeon: Abbie Sons, MD;  Location: [facility] ORS;  Service: Urology;  Laterality: Right;   CYSTOSCOPY W/ URETERAL STENT REMOVAL Right 03/26/2017   Procedure: CYSTOSCOPY WITH STENT REMOVAL;  Surgeon: Abbie Sons, MD;  Location: [facility] ORS;  Service: Urology;  Laterality: Right;   TONSILLECTOMY      Prior to Admission medications   Medication Sig Start Date End Date Taking? Authorizing Provider  norgestimate-ethinyl estradiol (ORTHO-CYCLEN) 0.25-35 MG-MCG tablet Take 1 tablet by mouth daily.  10/09/20 11/08/20 Yes Hinda Kehr, MD  ibuprofen (ADVIL) 600 MG tablet Take 1 tablet (600 mg total) by mouth every 6 (six) hours. 07/21/20   Harlin Heys, MD  Prenatal Vit-Fe Fumarate-FA (PRENATAL VITAMIN PO) Take by mouth in the morning.    [provider]    Allergies Red dye  Family History  Problem Relation Age of Onset   Diabetes Mother    Hypertension Mother    Cancer Maternal Aunt    Stroke Maternal Grandmother    Thyroid disease Maternal Grandmother    Breast cancer Paternal Grandmother    Hyperlipidemia Maternal Grandfather    Diabetes Maternal Grandfather    Chronic Renal Failure Maternal Grandfather    Heart Problems Maternal Grandfather        pacemaker    Hypertension Maternal Grandfather    Diabetes Paternal Grandfather    Heart Problems Brother    Asthma Brother     Social History Social History   Tobacco Use   Smoking status: Never   Smokeless tobacco: Never  Vaping Use   Vaping Use: Never used  Substance Use Topics   Alcohol use: No   Drug use: Not Currently    Types: Marijuana    Review of Systems Constitutional: No fever/chills Eyes: No visual changes. ENT: No sore throat. Cardiovascular: Denies chest pain. Respiratory: Denies shortness of breath. Gastrointestinal: Generalized cramping and dull abdominal pain.  No nausea nor vomiting. Genitourinary: Heavy vaginal bleeding with clots x2 days. Musculoskeletal: Negative for neck pain.  Negative for back pain. Integumentary: Negative for rash. Neurological: Negative for headaches, focal weakness or numbness.   ____________________________________________   PHYSICAL EXAM:  VITAL SIGNS: ED Triage Vitals  Enc Vitals Group     BP 10/08/20 1917 120/70     Pulse Rate 10/08/20 1917 78     Resp 10/08/20 1917 18     Temp 10/08/20 1917 99 F (37.2 C)     Temp Source 10/08/20 1917 Oral     SpO2 10/08/20 1917 100 %     Weight 10/08/20 1921 (!) 141.8 kg (312 lb 11.2 oz)     Height  10/08/20 1921 1.753 m (5\' 9" )     Head Circumference --      Peak Flow --      Pain Score 10/08/20 1921 10     Pain Loc --      Pain Edu? --      Excl. in [place]? --     Constitutional: Alert and oriented.  Eyes: Conjunctivae are normal.  Head: Atraumatic. Nose: No congestion/rhinnorhea. Mouth/Throat: Patient is wearing a mask. Neck: No stridor.  No meningeal signs.   Cardiovascular: Normal rate, regular rhythm. Good peripheral circulation. Respiratory: Normal respiratory effort.  No retractions. Gastrointestinal: Obese.  Soft and nondistended.  Diffuse mild tenderness to palpation throughout with any localized peritonitis.  No rebound and no guarding. Genitourinary: Deferred pelvic exam based on joint decision-making with the patient. Musculoskeletal: No lower extremity tenderness nor edema. No gross deformities of extremities. Neurologic:  Normal speech and language. No gross  focal neurologic deficits are appreciated.  Skin:  Skin is warm, dry and intact. Psychiatric: Mood and affect are normal. Speech and behavior are normal.  ____________________________________________   LABS (all labs ordered are listed, but only abnormal results are displayed)  Labs Reviewed  URINALYSIS, COMPLETE (UACMP) WITH MICROSCOPIC - Abnormal; Notable for the following components:      Result Value   Color, Urine YELLOW (*)    APPearance CLEAR (*)    Hgb urine dipstick LARGE (*)    Leukocytes,Ua TRACE (*)    All other components within normal limits  COMPREHENSIVE METABOLIC PANEL - Abnormal; Notable for the following components:   Glucose, Bld 114 (*)    All other components within normal limits  CBC WITH DIFFERENTIAL/PLATELET - Abnormal; Notable for the following components:   Hemoglobin 10.6 (*)    HCT 33.8 (*)    MCV 79.7 (*)    MCH 25.0 (*)    RDW 17.3 (*)    Neutro Abs 8.1 (*)    All other components within normal limits  HCG, QUANTITATIVE, PREGNANCY  POC URINE PREG, ED    ____________________________________________   INITIAL IMPRESSION / MDM / [place] / ED COURSE  As part of my medical decision making, I reviewed the following data within the [facility] notes reviewed and incorporated, Labs reviewed , and Notes from prior ED visits   Differential diagnosis includes, but is not limited to, menometrorrhagia/dysfunctional uterine bleeding, uterine fibroids, early pregnancy.  Vital signs are stable and within normal limits.  They have been stable throughout almost a 10-hour stay in the ED due to overwhelming hospital and ED patient volumes.  The hCG and urine pregnancy tests were negative.  Hemoglobin in the urine is probably the result of the vaginal bleeding.  Comprehensive metabolic panel is normal.  CBC is notable for a very mild anemia but her hemoglobin is actually higher than it was 2 months ago.  No indication of an emergent medical condition at this time.  The patient and I talked about doing a pelvic exam but she and I both agree it is likely of limited value and she would prefer to pass for now if it is not absolutely necessary.  There is no indication of any acute infection.  She has an overall reassuring exam.  We talked about Sprintec as a possibility to help regulate her hormonally, and she would like to try it but also I strongly encourage close follow-up with OB/GYN.  She will call to schedule follow-up appointment.  I gave my usual and customary return precautions.  No role for imaging tonight as there is no particular emergent medical condition to be ruled out; the patient's cesarean section was 2 months ago and there should be no retained products of this far out and she has no sign of infection.           ____________________________________________  FINAL CLINICAL IMPRESSION(S) / ED DIAGNOSES  Final diagnoses:  Menometrorrhagia     MEDICATIONS GIVEN DURING THIS VISIT:  Medications - No data  to display   ED Discharge Orders          Ordered    norgestimate-ethinyl estradiol (ORTHO-CYCLEN) 0.25-35 MG-MCG tablet  Daily        10/09/20 0357             Note:  This document was prepared using Dragon voice recognition software and may include unintentional dictation errors.   Hinda Kehr, MD 10/09/20  0357  

## 2020-10-11 ENCOUNTER — Other Ambulatory Visit: Payer: Self-pay

## 2020-10-11 ENCOUNTER — Encounter: Payer: Self-pay | Admitting: Emergency Medicine

## 2020-10-11 ENCOUNTER — Emergency Department
Admission: EM | Admit: 2020-10-11 | Discharge: 2020-10-11 | Disposition: A | Discharge status: Home / Self Care | Payer: Medicaid Other | Attending: Emergency Medicine | Admitting: Emergency Medicine

## 2020-10-11 ENCOUNTER — Emergency Department: Payer: Medicaid Other

## 2020-10-11 DIAGNOSIS — R194 Change in bowel habit: Secondary | ICD-10-CM | POA: Diagnosis not present

## 2020-10-11 DIAGNOSIS — Z79899 Other long term (current) drug therapy: Secondary | ICD-10-CM | POA: Insufficient documentation

## 2020-10-11 DIAGNOSIS — J45909 Unspecified asthma, uncomplicated: Secondary | ICD-10-CM | POA: Insufficient documentation

## 2020-10-11 DIAGNOSIS — E669 Obesity, unspecified: Secondary | ICD-10-CM | POA: Diagnosis not present

## 2020-10-11 DIAGNOSIS — K219 Gastro-esophageal reflux disease without esophagitis: Secondary | ICD-10-CM | POA: Insufficient documentation

## 2020-10-11 DIAGNOSIS — N739 Female pelvic inflammatory disease, unspecified: Secondary | ICD-10-CM

## 2020-10-11 DIAGNOSIS — D72829 Elevated white blood cell count, unspecified: Secondary | ICD-10-CM | POA: Diagnosis not present

## 2020-10-11 DIAGNOSIS — R1010 Upper abdominal pain, unspecified: Secondary | ICD-10-CM | POA: Diagnosis present

## 2020-10-11 LAB — URINALYSIS, COMPLETE (UACMP) WITH MICROSCOPIC
Glucose, UA: NEGATIVE mg/dL
Hgb urine dipstick: NEGATIVE
Ketones, ur: NEGATIVE mg/dL
Nitrite: NEGATIVE
Protein, ur: NEGATIVE mg/dL
Specific Gravity, Urine: 1.025 (ref 1.005–1.030)
pH: 6 (ref 5.0–8.0)

## 2020-10-11 LAB — CBC
HCT: 30.8 % — ABNORMAL LOW (ref 36.0–46.0)
Hemoglobin: 9.9 g/dL — ABNORMAL LOW (ref 12.0–15.0)
MCH: 24.8 pg — ABNORMAL LOW (ref 26.0–34.0)
MCHC: 32.1 g/dL (ref 30.0–36.0)
MCV: 77 fL — ABNORMAL LOW (ref 80.0–100.0)
Platelets: 319 10*3/uL (ref 150–400)
RBC: 4 MIL/uL (ref 3.87–5.11)
RDW: 17 % — ABNORMAL HIGH (ref 11.5–15.5)
WBC: 12.8 10*3/uL — ABNORMAL HIGH (ref 4.0–10.5)
nRBC: 0 % (ref 0.0–0.2)

## 2020-10-11 LAB — COMPREHENSIVE METABOLIC PANEL
ALT: 42 U/L (ref 0–44)
AST: 48 U/L — ABNORMAL HIGH (ref 15–41)
Albumin: 3.7 g/dL (ref 3.5–5.0)
Alkaline Phosphatase: 98 U/L (ref 38–126)
Anion gap: 7 (ref 5–15)
BUN: 15 mg/dL (ref 6–20)
CO2: 26 mmol/L (ref 22–32)
Calcium: 9.1 mg/dL (ref 8.9–10.3)
Chloride: 103 mmol/L (ref 98–111)
Creatinine, Ser: 0.71 mg/dL (ref 0.44–1.00)
GFR, Estimated: >60 mL/min (ref >60)
Glucose, Bld: 113 mg/dL — ABNORMAL HIGH (ref 70–99)
Potassium: 3.8 mmol/L (ref 3.5–5.1)
Sodium: 136 mmol/L (ref 135–145)
Total Bilirubin: 2 mg/dL — ABNORMAL HIGH (ref 0.3–1.2)
Total Protein: 7.7 g/dL (ref 6.5–8.1)

## 2020-10-11 LAB — CHLAMYDIA/NGC RT PCR (ARMC ONLY)
Chlamydia Tr: NOT DETECTED
N gonorrhoeae: DETECTED — AB

## 2020-10-11 LAB — WET PREP, GENITAL
Sperm: NONE SEEN
Trich, Wet Prep: NONE SEEN
Yeast Wet Prep HPF POC: NONE SEEN

## 2020-10-11 LAB — LIPASE, BLOOD: Lipase: 24 U/L (ref 11–51)

## 2020-10-11 MED ORDER — DOXYCYCLINE HYCLATE 100 MG PO TABS: 100.0000 mg | ORAL_TABLET | Freq: Two times a day (BID) | ORAL | 0 refills | Status: DC

## 2020-10-11 MED ORDER — ONDANSETRON HCL 4 MG/2ML IJ SOLN
4.0000 mg | Freq: Once | INTRAMUSCULAR | Status: AC
  Administered 2020-10-11: 4 mg via INTRAVENOUS
  Filled 2020-10-11: qty 2

## 2020-10-11 MED ORDER — MORPHINE SULFATE (PF) 4 MG/ML IV SOLN
4.0000 mg | Freq: Once | INTRAVENOUS | Status: AC
  Administered 2020-10-11: 4 mg via INTRAVENOUS
  Filled 2020-10-11: qty 1

## 2020-10-11 MED ORDER — METRONIDAZOLE 500 MG PO TABS
500.0000 mg | ORAL_TABLET | Freq: Two times a day (BID) | ORAL | Status: DC
  Administered 2020-10-11: 500 mg via ORAL
  Filled 2020-10-11: qty 1

## 2020-10-11 MED ORDER — METRONIDAZOLE 500 MG PO TABS: 500.0000 mg | ORAL_TABLET | Freq: Two times a day (BID) | ORAL | 0 refills | Status: DC

## 2020-10-11 MED ORDER — IOHEXOL 350 MG/ML SOLN
80.0000 mL | Freq: Once | INTRAVENOUS | Status: AC | PRN
  Administered 2020-10-11: 80 mL via INTRAVENOUS

## 2020-10-11 MED ORDER — SODIUM CHLORIDE 0.9 % IV BOLUS
1000.0000 mL | Freq: Once | INTRAVENOUS | Status: AC
  Administered 2020-10-11: 1000 mL via INTRAVENOUS

## 2020-10-11 MED ORDER — DOXYCYCLINE HYCLATE 100 MG PO TABS
100.0000 mg | ORAL_TABLET | Freq: Two times a day (BID) | ORAL | Status: DC
  Administered 2020-10-11: 100 mg via ORAL
  Filled 2020-10-11: qty 1

## 2020-10-11 MED ORDER — OXYCODONE-ACETAMINOPHEN 5-325 MG PO TABS: 1.0000 | ORAL_TABLET | Freq: Four times a day (QID) | ORAL | 0 refills | Status: DC | PRN

## 2020-10-11 NOTE — ED Notes (Signed)
Pt give apple juice and resting in bed no needs at this time. Endorses minor pain

## 2020-10-11 NOTE — ED Notes (Signed)
Pt attempted to give a urine specimen but is unable to at this time

## 2020-10-11 NOTE — ED Provider Notes (Signed)
[facility] Emergency Department Provider Note   ____________________________________________   Event Date/Time   First MD Initiated Contact with Patient 10/11/20 [phone]     (approximate)  I have reviewed the triage vital signs and the nursing notes.   HISTORY  Chief Complaint Abdominal Pain    HPI Joanna [name] is a 20 y.o. female history of C-section a few months ago.  Comes today reports that for 2 days now she has been able to have a bowel movement and also has not been able to urinate.  She has an urge to urinate and also urge to defecate but reports she cannot seem to have a bowel movement or pass gas or urinate.  She reports nausea and vomited twice vomiting up liquids that she tried to drink.  Denies any black or bloody vomit  No fevers or chills no chest pain.  Was having vaginal bleeding but started on new medication and reports that she has not had any further vaginal bleeding.  No vaginal discharge.  Is sexually active but last sexually active a few weeks ago  She reports she just feels like her bowels are blocked.  Denies constipation.  Past Medical History:  Diagnosis Date   Anemia    Anxiety    Asthma    h/o no problems in adulthood   Bipolar 1 disorder ([place])    Complication of anesthesia    "I could not walk without assistance for 2 months after my epidural from my c-section"   GERD (gastroesophageal reflux disease)    Headache    Migraines   History of kidney stones    Thrombophlebitis 02/2017   septic pelvic venous thrombophlebitis    Patient Active Problem List   Diagnosis Date Noted   Macrosomia affecting management of mother in third trimester 07/19/2020   Post-operative state 07/19/2020   Desires VBAC (vaginal birth after cesarean) trial 06/21/2020   History of macrosomia in infant in prior pregnancy, currently pregnant 06/21/2020   LGA (large for gestational age) infant 06/21/2020   Personal history of COVID-19  06/21/2020   COVID-19 affecting pregnancy in third trimester    H/O migraine during pregnancy 06/04/2020   Anemia of pregnancy in third trimester 05/06/2020   Decreased fetus movements affecting management of mother in second trimester    Postoperative wound infection    Sepsis ([place]) 02/03/2017   Labor and delivery indication for care or intervention 01/28/2017   Pregnant 12/24/2016   Positive urine drug screen 07/09/2016   Morbid obesity with BMI of 50.0-59.9, adult ([place]) 07/09/2016   High risk teen pregnancy 07/09/2016   Asthma 09/27/2012    Past Surgical History:  Procedure Laterality Date   CESAREAN SECTION N/A 01/28/2017   Procedure: CESAREAN SECTION;  Surgeon: Harlin Heys, MD;  Location: [facility] ORS;  Service: Obstetrics;  Laterality: N/A;   CESAREAN SECTION N/A 07/19/2020   Procedure: CESAREAN SECTION REPEAT;  Surgeon: Harlin Heys, MD;  Location: [facility] ORS;  Service: Obstetrics;  Laterality: N/A;   CYSTOSCOPY W/ RETROGRADES Right 03/26/2017   Procedure: CYSTOSCOPY WITH RETROGRADE PYELOGRAM;  Surgeon: Abbie Sons, MD;  Location: [facility] ORS;  Service: Urology;  Laterality: Right;   CYSTOSCOPY W/ URETERAL STENT PLACEMENT Right 02/06/2017   Procedure: CYSTOSCOPY WITH RETROGRADE PYELOGRAM/URETERAL STENT PLACEMENT;  Surgeon: Abbie Sons, MD;  Location: [facility] ORS;  Service: Urology;  Laterality: Right;   CYSTOSCOPY W/ URETERAL STENT REMOVAL Right 03/26/2017   Procedure: CYSTOSCOPY WITH STENT REMOVAL;  Surgeon: Bernardo Heater,  Ronda Fairly, MD;  Location: [facility] ORS;  Service: Urology;  Laterality: Right;   TONSILLECTOMY      Prior to Admission medications   Medication Sig Start Date End Date Taking? Authorizing Provider  oxyCODONE-acetaminophen (PERCOCET) 5-325 MG tablet Take 1-2 tablets by mouth every 6 (six) hours as needed for severe pain. 10/11/20 10/11/21 Yes Rubie Maid, MD  doxycycline (VIBRA-TABS) 100 MG tablet Take 1 tablet (100 mg total) by mouth every 12 (twelve) hours.  10/11/20   Rubie Maid, MD  ibuprofen (ADVIL) 600 MG tablet Take 1 tablet (600 mg total) by mouth every 6 (six) hours. 07/21/20   Harlin Heys, MD  metroNIDAZOLE (FLAGYL) 500 MG tablet Take 1 tablet (500 mg total) by mouth every 12 (twelve) hours. 10/11/20   Rubie Maid, MD  norgestimate-ethinyl estradiol (ORTHO-CYCLEN) 0.25-35 MG-MCG tablet Take 1 tablet by mouth daily. 10/09/20 11/08/20  Hinda Kehr, MD    Allergies Red dye  Family History  Problem Relation Age of Onset   Diabetes Mother    Hypertension Mother    Cancer Maternal Aunt    Stroke Maternal Grandmother    Thyroid disease Maternal Grandmother    Breast cancer Paternal Grandmother    Hyperlipidemia Maternal Grandfather    Diabetes Maternal Grandfather    Chronic Renal Failure Maternal Grandfather    Heart Problems Maternal Grandfather        pacemaker    Hypertension Maternal Grandfather    Diabetes Paternal Grandfather    Heart Problems Brother    Asthma Brother     Social History Social History   Tobacco Use   Smoking status: Never   Smokeless tobacco: Never  Vaping Use   Vaping Use: Never used  Substance Use Topics   Alcohol use: No   Drug use: Not Currently    Types: Marijuana    Review of Systems Constitutional: No fever/chills Cardiovascular: Denies chest pain. Respiratory: Denies shortness of breath. Gastrointestinal: See HPI Genitourinary: Negative for dysuria Stanton Kidney reports she feels like she cannot urinate for 48 hours.  Denies pregnancy.  No vaginal discharge or pelvic pain.  Vaginal bleeding she was having a stop Musculoskeletal: Negative for back pain. Skin: Negative for rash. Neurological: Negative for headaches, areas of focal weakness or numbness.    ____________________________________________   PHYSICAL EXAM:  VITAL SIGNS: ED Triage Vitals  Enc Vitals Group     BP 10/11/20 0520 122/61     Pulse Rate 10/11/20 0520 (!) 101     Resp 10/11/20 0520 19     Temp 10/11/20  0520 98.8 F (37.1 C)     Temp Source 10/11/20 0520 Oral     SpO2 10/11/20 0520 98 %     Weight --      Height --      Head Circumference --      Peak Flow --      Pain Score 10/11/20 0529 10     Pain Loc --      Pain Edu? --      Excl. in [place]? --     Constitutional: Alert and oriented. Well appearing and in no acute distress. Eyes: Conjunctivae are normal. Head: Atraumatic. Nose: No congestion/rhinnorhea. Mouth/Throat: Patient wearing mask but shows no evidence of difficulty breathing. Neck: No stridor.  Cardiovascular: Normal rate, regular rhythm. Grossly normal heart sounds.  Good peripheral circulation. Respiratory: Normal respiratory effort.  No retractions. Lungs CTAB. Gastrointestinal: Soft and moderate abdominal tenderness throughout.  No obvious focality to the point pain,  but patient does report moderate tenderness.  No rebound or guarding or evidence of acute peritonitis. Musculoskeletal: No lower extremity tenderness nor edema. Neurologic:  Normal speech and language. No gross focal neurologic deficits are appreciated.  Skin:  Skin is warm, dry and intact. No rash noted. Psychiatric: Mood and affect are normal. Speech and behavior are normal.  ____________________________________________   LABS (all labs ordered are listed, but only abnormal results are displayed)  Labs Reviewed  COMPREHENSIVE METABOLIC PANEL - Abnormal; Notable for the following components:      Result Value   Glucose, Bld 113 (*)    AST 48 (*)    Total Bilirubin 2.0 (*)    All other components within normal limits  CBC - Abnormal; Notable for the following components:   WBC 12.8 (*)    Hemoglobin 9.9 (*)    HCT 30.8 (*)    MCV 77.0 (*)    MCH 24.8 (*)    RDW 17.0 (*)    All other components within normal limits  URINALYSIS, COMPLETE (UACMP) WITH MICROSCOPIC - Abnormal; Notable for the following components:   Bilirubin Urine SMALL (*)    Leukocytes,Ua SMALL (*)    Bacteria, UA FEW (*)     All other components within normal limits  CHLAMYDIA/NGC RT PCR ([facility] ONLY)            WET PREP, GENITAL  URINE CULTURE  LIPASE, BLOOD   ____________________________________________  EKG   ____________________________________________  RADIOLOGY  IMPRESSION: 1. Extensive inflammation in the low anatomic pelvis where there are 2 loculated fluid collections which demonstrate a small amount of rim enhancement. Given the patient's history of prior C-section on 07/19/2020, findings are concerning for widespread pelvic phlegmonous inflammation and possible infected postoperative fluid collections. Alternatively, the possibility of an unrelated etiology such as pelvic inflammatory disease (PID) could be considered. Further clinical evaluation is recommended. 2. Normal appendix. 3. Stool burden does not appear excessive to suggest constipation.  CT imaging reviewed, noted as above.  Inflammatory changes of the lower pelvis and 2 loculated fluid collections.  These were discussed with Dr. Marcelline Mates who also saw and evaluated and performed consult in the ER ____________________________________________   PROCEDURES  Procedure(s) performed: None  Procedures  Critical Care performed: No  ____________________________________________   INITIAL IMPRESSION / ASSESSMENT AND PLAN / ED COURSE  Pertinent labs & imaging results that were available during my care of the patient were reviewed by me and considered in my medical decision making (see chart for details).   Patient presents for abdominal pain.  Recently seen 2 days ago for vaginal bleeding.  She reports bleeding has resolved but she is now having abdominal pain feels urgency to urinate difficulty defecating and also reports vomiting twice earlier today.  Clinical history symptoms and slightly elevated white count suggest potential intra-abdominal etiology and possible infection.  Will obtain CT imaging.  Denies pelvic discharge or  pelvic pain however does report abdominal pain somewhat throughout.  History of recent C-section few months ago  Clinical Course as of 10/11/20 1503  Fri Oct 11, 2020  0907 Are reviewed ED visit from 2 days ago.  Negative pregnancy test at that time.  Having vaginal discharge, started on the birth control tablet.  Symptoms of vaginal bleeding have resolved.  Denies any lower abdominal symptoms or vaginal discharge or bleeding.  Reports now having nausea middle to upper abdominal pain [MQ]  1212 Case reviewed with Dr. Marcelline Mates.  She is also reviewed labs and  CT imaging.  Dr. Marcelline Mates will come perform consult in ED for further recommendations [MQ]  1212 Patient is resting comfortably now has had a total of 8 mg morphine.  Awake alert and oriented understanding of awaiting OB consult [MQ]  1432 Patient tolerating by mouth and well, has eaten crackers and drank 2 cups of juice and water.  Pain currently well controlled.  OB/GYN Dr. Marcelline Mates has advised discharge with antibiotics and pain medication for which Dr. Marcelline Mates will prescribe. [MQ]  1446 I discussed with Dr. Marcelline Mates Dr. Marcelline Mates providing flagyl, doxycycline, and pain medication. Dr. Marcelline Mates will send in her prescriptions to pharmacy now. [MQ]  3383 Patient understanding agreeable with this plan [MQ]    Clinical Course User Index [MQ] Delman Kitten, MD   ----------------------------------------- 3:02 PM on 10/11/2020 ----------------------------------------- Patient is resting comfortably.  Normal vital signs except for slight hypertension.  Fully awake and alert tolerating by mouth.  Has been seen by OB/GYN, antibiotics prescribed by OB/GYN as well as pain medication.  Patient understanding very agreeable with plan for close follow-up, outpatient antibiotic treatment, and careful return precautions.  She reports that she has family who can drive her home she was given morphine here in the ER today.  Patient now knows not to drive self home  Nontoxic,  well-appearing.  Agree with OB/GYN appears appropriate for close follow-up and antibiotic therapy.  OB advises no indication for acute surgery admission at this time  Return precautions and treatment recommendations and follow-up discussed with the patient who is agreeable with the plan.   ____________________________________________   FINAL CLINICAL IMPRESSION(S) / ED DIAGNOSES  Final diagnoses:  Pelvic infection in female        Note:  This document was prepared using Dragon voice recognition software and may include unintentional dictation errors       Delman Kitten, MD 10/11/20 1548

## 2020-10-11 NOTE — Discharge Instructions (Signed)
Dr. Marcelline Mates is sending prescriptions in for 2 antibiotics as well as a pain medication for you to use.  Dr. Marcelline Mates will be calling you as well to arrange close follow-up.  Please return to the emergency room right away if you are to develop a fever, severe nausea, your pain becomes severe or worsens, you are unable to keep food down, begin vomiting any dark or bloody fluid, you develop any dark or bloody stools, feel dehydrated, or other new concerns or symptoms arise.

## 2020-10-11 NOTE — ED Notes (Signed)
Pt given DC inst. Pt denies questions at this time. Pt given RX information and instructions. Pt assisted off unit

## 2020-10-11 NOTE — ED Notes (Signed)
Pt resting in bed with eyes closed, NAD, call light in reach, bed locked and low.

## 2020-10-11 NOTE — ED Notes (Signed)
Report given to Gregary Signs, RN, will bladder scan pt upon assessment.

## 2020-10-11 NOTE — Consult Note (Signed)
Reason for Consult: possible pelvic infection Referring Physician: Delman Kitten, MD (ER Physician)  Joanna [name] is an 20 y.o. [phone] female who presented to the Emergency Room with complaints of nausea and vomiting twice today, difficulty with bowel movements (notes she has not had a bowel movement one in 3-4 days, last one was very loose stool).   She denies fevers, chills, dysuria, bloody stools.  Of note, patient was seen in the Emergency Room yesterday due to complaints of vaginal bleeding and pelvic cramping.  She had a cycle approximately 1 week prior to her abnormal bleeding. She was initiated on OCPs for management of her bleeding.  Pertinent Gynecological History: Menses: irregular Bleeding: dysfunctional uterine bleeding Contraception: OCP (estrogen/progesterone) - initiated yesterday Blood transfusions: none Sexually transmitted diseases: no past history Previous GYN Procedures:  None   Last pap: No prior pap history  Menstrual History: Menarche age: 54 Patient's last menstrual period was 09/30/2020.     OB History  Gravida Para Term Preterm AB Living  2 2 2     2   SAB IAB Ectopic Multiple Live Births        0 2    # Outcome Date GA Lbr Len/2nd Weight Sex Delivery Anes PTL Lv  2 Term 07/19/20 [redacted]w[redacted]d  4230 g M CS-LVertical Spinal  LIV  1 Term 01/28/17 109w0d  4480 g F CS-LTranv Spinal, EPI  LIV     Birth Comments: female     Past Medical History:  Diagnosis Date   Anemia    Anxiety    Asthma    h/o no problems in adulthood   Bipolar 1 disorder ([place])    Complication of anesthesia    "I could not walk without assistance for 2 months after my epidural from my c-section"   GERD (gastroesophageal reflux disease)    Headache    Migraines   History of kidney stones    Thrombophlebitis 02/2017   septic pelvic venous thrombophlebitis    Past Surgical History:  Procedure Laterality Date   CESAREAN SECTION N/A 01/28/2017   Procedure: CESAREAN SECTION;   Surgeon: Harlin Heys, MD;  Location: [facility];  Service: Obstetrics;  Laterality: N/A;   CESAREAN SECTION N/A 07/19/2020   Procedure: CESAREAN SECTION REPEAT;  Surgeon: Harlin Heys, MD;  Location: [facility];  Service: Obstetrics;  Laterality: N/A;   CYSTOSCOPY W/ RETROGRADES Right 03/26/2017   Procedure: CYSTOSCOPY WITH RETROGRADE PYELOGRAM;  Surgeon: Abbie Sons, MD;  Location: [facility];  Service: Urology;  Laterality: Right;   CYSTOSCOPY W/ URETERAL STENT PLACEMENT Right 02/06/2017   Procedure: CYSTOSCOPY WITH RETROGRADE PYELOGRAM/URETERAL STENT PLACEMENT;  Surgeon: Abbie Sons, MD;  Location: [facility];  Service: Urology;  Laterality: Right;   CYSTOSCOPY W/ URETERAL STENT REMOVAL Right 03/26/2017   Procedure: CYSTOSCOPY WITH STENT REMOVAL;  Surgeon: Abbie Sons, MD;  Location: [facility];  Service: Urology;  Laterality: Right;   TONSILLECTOMY      Family History  Problem Relation Age of Onset   Diabetes Mother    Hypertension Mother    Cancer Maternal Aunt    Stroke Maternal Grandmother    Thyroid disease Maternal Grandmother    Breast cancer Paternal Grandmother    Hyperlipidemia Maternal Grandfather    Diabetes Maternal Grandfather    Chronic Renal Failure Maternal Grandfather    Heart Problems Maternal Grandfather        pacemaker    Hypertension Maternal Grandfather    Diabetes Paternal Grandfather  Heart Problems Brother    Asthma Brother     Social History:  reports that she has never smoked. She has never used smokeless tobacco. She reports that she does not currently use drugs after having used the following drugs: Marijuana. She reports that she does not drink alcohol.  Allergies:  Allergies  Allergen Reactions   Red Dye Swelling and Rash    Medications:  No current facility-administered medications on file prior to encounter.   Current Outpatient Medications on File Prior to Encounter  Medication Sig Dispense Refill   ibuprofen (ADVIL)  600 MG tablet Take 1 tablet (600 mg total) by mouth every 6 (six) hours. 30 tablet 0   norgestimate-ethinyl estradiol (ORTHO-CYCLEN) 0.25-35 MG-MCG tablet Take 1 tablet by mouth daily. 30 tablet 0     Review of Systems  Constitutional:  Negative for activity change, chills and fever.  HENT: Negative.    Eyes: Negative.   Respiratory: Negative.    Cardiovascular: Negative.   Gastrointestinal:  Positive for nausea and vomiting.  Genitourinary:  Positive for menstrual problem, pelvic pain and vaginal bleeding.  Musculoskeletal: Negative.   Skin: Negative.   Neurological: Negative.    Blood pressure (!) 148/58, pulse 60, temperature 98.8 F (37.1 C), temperature source Oral, resp. rate 18, last menstrual period 09/30/2020, SpO2 99 %, not currently breastfeeding. Physical Exam Constitutional:      General: She is not in acute distress.    Appearance: She is obese.  HENT:     Head: Normocephalic and atraumatic.  Cardiovascular:     Rate and Rhythm: Normal rate and regular rhythm.  Pulmonary:     Effort: Pulmonary effort is normal.     Breath sounds: Normal breath sounds.  Abdominal:     General: Abdomen is flat. A surgical scar is present. Bowel sounds are normal. There is no distension.     Palpations: Abdomen is soft. There is no mass.     Tenderness: There is abdominal tenderness in the right lower quadrant and left lower quadrant.  Genitourinary:    Comments: Deferred as patient had exam yesterday. Allowed to perform blind vaginal swabs. Skin:    General: Skin is warm.  Neurological:     General: No focal deficit present.     Mental Status: She is alert and oriented to person, place, and time.    Results for orders placed or performed during the hospital encounter of 10/11/20 (from the past 48 hour(s))  Lipase, blood     Status: None   Collection Time: 10/11/20  5:38 AM  Result Value Ref Range   Lipase 24 11 - 51 U/L    Comment: Performed at [facility], [address] [place], [place] [address]  Comprehensive metabolic panel     Status: Abnormal   Collection Time: 10/11/20  5:38 AM  Result Value Ref Range   Sodium 136 135 - 145 mmol/L   Potassium 3.8 3.5 - 5.1 mmol/L   Chloride 103 98 - 111 mmol/L   CO2 26 22 - 32 mmol/L   Glucose, Bld 113 (H) 70 - 99 mg/dL    Comment: Glucose reference range applies only to samples taken after fasting for at least 8 hours.   BUN 15 6 - 20 mg/dL   Creatinine, Ser 0.71 0.44 - 1.00 mg/dL   Calcium 9.1 8.9 - 10.3 mg/dL   Total Protein 7.7 6.5 - 8.1 g/dL   Albumin 3.7 3.5 - 5.0 g/dL   AST 48 (H)  15 - 41 U/L   ALT 42 0 - 44 U/L   Alkaline Phosphatase 98 38 - 126 U/L   Total Bilirubin 2.0 (H) 0.3 - 1.2 mg/dL   GFR, Estimated >60 >60 mL/min    Comment: (NOTE) Calculated using the CKD-EPI Creatinine Equation (2021)    Anion gap 7 5 - 15    Comment: Performed at [facility], [address] [place], [place] [address]  CBC     Status: Abnormal   Collection Time: 10/11/20  5:38 AM  Result Value Ref Range   WBC 12.8 (H) 4.0 - 10.5 K/uL   RBC 4.00 3.87 - 5.11 MIL/uL   Hemoglobin 9.9 (L) 12.0 - 15.0 g/dL   HCT 30.8 (L) 36.0 - 46.0 %   MCV 77.0 (L) 80.0 - 100.0 fL   MCH 24.8 (L) 26.0 - 34.0 pg   MCHC 32.1 30.0 - 36.0 g/dL   RDW 17.0 (H) 11.5 - 15.5 %   Platelets 319 150 - 400 K/uL   nRBC 0.0 0.0 - 0.2 %    Comment: Performed at [facility], [address] [place], [place] [address]  Urinalysis, Complete w Microscopic Urine, Clean Catch     Status: Abnormal   Collection Time: 10/11/20  7:49 AM  Result Value Ref Range   Color, Urine YELLOW YELLOW   APPearance CLEAR CLEAR   Specific Gravity, Urine 1.025 1.005 - 1.030   pH 6.0 5.0 - 8.0   Glucose, UA NEGATIVE NEGATIVE mg/dL   Hgb urine dipstick NEGATIVE NEGATIVE   Bilirubin Urine SMALL (A) NEGATIVE   Ketones, ur NEGATIVE NEGATIVE mg/dL   Protein, ur NEGATIVE NEGATIVE mg/dL   Nitrite NEGATIVE NEGATIVE   Leukocytes,Ua SMALL (A)  NEGATIVE   Squamous Epithelial / LPF 11-20 0 - 5   WBC, UA 21-50 0 - 5 WBC/hpf   RBC / HPF 0-5 0 - 5 RBC/hpf   Bacteria, UA FEW (A) NONE SEEN    Comment: Performed at [facility], [address] [place], [place] [address]    CT ABDOMEN PELVIS W CONTRAST  Result Date: 10/11/2020 CLINICAL DATA:  20 year old female with history of suspected bowel obstruction, unable to defecate for 48 hours. EXAM: CT ABDOMEN AND PELVIS WITH CONTRAST TECHNIQUE: Multidetector CT imaging of the abdomen and pelvis was performed using the standard protocol following bolus administration of intravenous contrast. CONTRAST:  8mL OMNIPAQUE IOHEXOL 350 MG/ML SOLN COMPARISON:  CT the abdomen and pelvis 07/06/2017. FINDINGS: Lower chest: Mild scarring in the left lower lobe. Hepatobiliary: No suspicious cystic or solid hepatic lesions. No intra or extrahepatic biliary ductal dilatation. Gallbladder is normal in appearance. Pancreas: No pancreatic mass. No pancreatic ductal dilatation. No pancreatic or peripancreatic fluid collections or inflammatory changes. Spleen: Unremarkable. Adrenals/Urinary Tract: Bilateral kidneys and bilateral adrenal glands are normal in appearance. No hydroureteronephrosis. Urinary bladder is unremarkable in appearance. Haziness in the perivesical fat. Stomach/Bowel: Unenhanced appearance of the stomach is normal. There is no pathologic dilatation of small bowel or colon. Stool burden does not appear excessive. Normal appendix. Vascular/Lymphatic: No significant atherosclerotic disease, aneurysm or dissection noted in the abdominal or pelvic vasculature. No lymphadenopathy noted in the abdomen or pelvis. Reproductive: Uterus and ovaries are unremarkable in appearance. Haziness in the adnexal regions bilaterally. Other: Mild diffuse haziness in the small bowel mesentery in the pelvis, as well as the inferior aspect of the omentum which extends to the pelvis. Haziness in perivesical fat. Small fluid  collections which appear loculated are  noted in the cul-de-sac (axial image 79 of series 2 and coronal image 79 of series 6) measuring 6.8 x 4.0 x 4.3 cm, and in the right lower quadrant superior and lateral to the urinary bladder (axial image 66 of series 2 and coronal image 49 of series 6) measuring 4.6 x 2.4 x 2.7 cm. Both of these demonstrate a small amount of rim enhancement. No internal gas within either of these collections. Trace volume of ascites. No pneumoperitoneum. Musculoskeletal: There are no aggressive appearing lytic or blastic lesions noted in the visualized portions of the skeleton. IMPRESSION: 1. Extensive inflammation in the low anatomic pelvis where there are 2 loculated fluid collections which demonstrate a small amount of rim enhancement. Given the patient's history of prior C-section on 07/19/2020, findings are concerning for widespread pelvic phlegmonous inflammation and possible infected postoperative fluid collections. Alternatively, the possibility of an unrelated etiology such as pelvic inflammatory disease (PID) could be considered. Further clinical evaluation is recommended. 2. Normal appendix. 3. Stool burden does not appear excessive to suggest constipation. Electronically Signed   By: Vinnie Langton M.D.   On: 10/11/2020 11:24    Assessment/Plan: Pelvic inflammation - concerning for pelvic infection vs post-operative fluid collection. Patient received IV pain meds, now pain is more tolerable. Allowed to collect self vaginal swabs.  Patient allowed to have PO challenge. If tolerating, can treat with PO antibiotics (Doxycline and Flagyl). She otherwise remains afebrile and shows no other signs of significant infection requiring inpatient treatment. Advised that if pain continues, should return for further evaluation and possible admission.  Dysfunctional uterine bleeding - to continue on OCPs for management, was just prescribed yesterday.  3. Patient will need to f/u in the  office next week.    Rubie Maid, MD [facility] 10/11/2020

## 2020-10-11 NOTE — ED Notes (Signed)
Patient transported to CT 

## 2020-10-11 NOTE — ED Notes (Signed)
Pt PVR 142mL. Pt now co pain under belly button. My Quale informed.

## 2020-10-11 NOTE — ED Triage Notes (Signed)
Pt to ED via POV, c/o lower abd pain. Pt states feels "swollen on the inside". Pt denies further vaginal bleeding at this time. Pt states constipation and dark urine as well. Pt states she feels like when she's at work, she has to hold her flatulence in or hold her bowels and feels like that is attributing to her abd pain.

## 2020-10-11 NOTE — ED Notes (Signed)
Pt assisted to bsc, states she has to urinate. Post void residual unable at this time, pt taken for CT. Urine dark amber, clear.

## 2020-10-11 NOTE — ED Notes (Signed)
Bladder scanned post CT scan and 30 min post void, 444ml in bladder, pt states she feels the urge to urinate, will recheck bladder scan when she is done.

## 2020-10-12 ENCOUNTER — Telehealth: Payer: Self-pay | Admitting: Obstetrics and Gynecology

## 2020-10-12 NOTE — Telephone Encounter (Signed)
Attempted to call patient regarding test results (positive gonorrhea testing). No answer, voicemail not set up.  Will also attempt to send Mychart message.    Rubie Maid, MD [facility]

## 2020-10-13 LAB — URINE CULTURE

## 2020-10-14 ENCOUNTER — Encounter: Payer: Self-pay | Admitting: Obstetrics and Gynecology

## 2020-10-14 ENCOUNTER — Other Ambulatory Visit: Payer: Self-pay

## 2020-10-14 ENCOUNTER — Ambulatory Visit (INDEPENDENT_AMBULATORY_CARE_PROVIDER_SITE_OTHER): Payer: Medicaid Other | Admitting: Obstetrics and Gynecology

## 2020-10-14 DIAGNOSIS — A549 Gonococcal infection, unspecified: Secondary | ICD-10-CM

## 2020-10-14 MED ORDER — CEFTRIAXONE SODIUM 500 MG IJ SOLR
500.0000 mg | Freq: Once | INTRAMUSCULAR | Status: AC
  Administered 2020-10-14: 500 mg via INTRAMUSCULAR

## 2020-10-14 NOTE — Progress Notes (Signed)
Pt present for Rocephin injection only. Name and DOB verified. POS Gonorrhea on GC/CH swab on 10/11/2020 at the Emergency Room. No known allergies to Rocephin. Rocephin 500 mg given. Pt tolerated well.

## 2020-10-14 NOTE — Progress Notes (Deleted)
Pt present for Rocephin injection only. Name and DOB verified. POS Gonorrhea on GC/CH swab on 10/11/2020 at the Emergency Room. No known allergies to Rocephin. Rocephin 500 mg given. Patient was informed to RTC in 4 week for TOC and not to have sex for 7 day.  Pt tolerated well.

## 2020-11-21 ENCOUNTER — Emergency Department
Admission: EM | Admit: 2020-11-21 | Discharge: 2020-11-21 | Disposition: A | Discharge status: Home / Self Care | Payer: Medicaid Other | Attending: Emergency Medicine | Admitting: Emergency Medicine

## 2020-11-21 ENCOUNTER — Other Ambulatory Visit: Payer: Self-pay

## 2020-11-21 DIAGNOSIS — Z8616 Personal history of COVID-19: Secondary | ICD-10-CM | POA: Diagnosis not present

## 2020-11-21 DIAGNOSIS — J101 Influenza due to other identified influenza virus with other respiratory manifestations: Secondary | ICD-10-CM | POA: Insufficient documentation

## 2020-11-21 DIAGNOSIS — R509 Fever, unspecified: Secondary | ICD-10-CM | POA: Diagnosis present

## 2020-11-21 DIAGNOSIS — J45909 Unspecified asthma, uncomplicated: Secondary | ICD-10-CM | POA: Diagnosis not present

## 2020-11-21 DIAGNOSIS — Z20822 Contact with and (suspected) exposure to covid-19: Secondary | ICD-10-CM | POA: Insufficient documentation

## 2020-11-21 DIAGNOSIS — J111 Influenza due to unidentified influenza virus with other respiratory manifestations: Secondary | ICD-10-CM

## 2020-11-21 LAB — RESP PANEL BY RT-PCR (FLU A&B, COVID) ARPGX2
Influenza A by PCR: POSITIVE — AB
Influenza B by PCR: NEGATIVE
SARS Coronavirus 2 by RT PCR: NEGATIVE

## 2020-11-21 MED ORDER — ONDANSETRON 4 MG PO TBDP: 4.0000 mg | ORAL_TABLET | Freq: Three times a day (TID) | ORAL | 0 refills | Status: DC | PRN

## 2020-11-21 NOTE — ED Provider Notes (Signed)
[facility] Emergency Department Provider Note   ____________________________________________    I have reviewed the triage vital signs and the nursing notes.   HISTORY  Chief Complaint Fever     HPI Joanna [name] is a 20 y.o. female with a history as noted below who presents with complaints of fevers chills, body aches, occasional cough, mild headache.  Patient reports symptoms started yesterday.  Denies sick contacts.  Has not take anything for this.  No abdominal pain or dysuria.  No flank pain.  Past Medical History:  Diagnosis Date   Anemia    Anxiety    Asthma    h/o no problems in adulthood   Bipolar 1 disorder ([place])    Complication of anesthesia    "I could not walk without assistance for 2 months after my epidural from my c-section"   GERD (gastroesophageal reflux disease)    Headache    Migraines   History of kidney stones    Thrombophlebitis 02/2017   septic pelvic venous thrombophlebitis    Patient Active Problem List   Diagnosis Date Noted   Macrosomia affecting management of mother in third trimester 07/19/2020   Post-operative state 07/19/2020   Desires VBAC (vaginal birth after cesarean) trial 06/21/2020   History of macrosomia in infant in prior pregnancy, currently pregnant 06/21/2020   LGA (large for gestational age) infant 06/21/2020   Personal history of COVID-19 06/21/2020   COVID-19 affecting pregnancy in third trimester    H/O migraine during pregnancy 06/04/2020   Anemia of pregnancy in third trimester 05/06/2020   Decreased fetus movements affecting management of mother in second trimester    Postoperative wound infection    Sepsis ([place]) 02/03/2017   Labor and delivery indication for care or intervention 01/28/2017   Pregnant 12/24/2016   Positive urine drug screen 07/09/2016   Morbid obesity with BMI of 50.0-59.9, adult ([place]) 07/09/2016   High risk teen pregnancy 07/09/2016   Asthma 09/27/2012     Past Surgical History:  Procedure Laterality Date   CESAREAN SECTION N/A 01/28/2017   Procedure: CESAREAN SECTION;  Surgeon: Harlin Heys, MD;  Location: [facility] ORS;  Service: Obstetrics;  Laterality: N/A;   CESAREAN SECTION N/A 07/19/2020   Procedure: CESAREAN SECTION REPEAT;  Surgeon: Harlin Heys, MD;  Location: [facility] OR[facility];  Service: Obstetrics;  Laterality: N/A;   CYSTOSCOPY W/ RETROGRADES Right 03/26/2017   Procedure: CYSTOSCOPY WITH RETROGRADE PYELOGRAM;  Surgeon: Abbie Sons, MD;  Location: [facility] ORS;  Service: Urology;  Laterality: Right;   CYSTOSCOPY W/ URETERAL STENT PLACEMENT Right 02/06/2017   Procedure: CYSTOSCOPY WITH RETROGRADE PYELOGRAM/URETERAL STENT PLACEMENT;  Surgeon: Abbie Sons, MD;  Location: [facility] OR[facility];  Service: Urology;  Laterality: Right;   CYSTOSCOPY W/ URETERAL STENT REMOVAL Right 03/26/2017   Procedure: CYSTOSCOPY WITH STENT REMOVAL;  Surgeon: Abbie Sons, MD;  Location: [facility] OR[facility];  Service: Urology;  Laterality: Right;   TONSILLECTOMY      Prior to Admission medications   Medication Sig Start Date End Date Taking? Authorizing Provider  ondansetron (ZOFRAN ODT) 4 MG disintegrating tablet Take 1 tablet (4 mg total) by mouth every 8 (eight) hours as needed. 11/21/20  Yes Lavonia Drafts, MD  norgestimate-ethinyl estradiol (ORTHO-CYCLEN) 0.25-35 MG-MCG tablet Take 1 tablet by mouth daily. 10/09/20 11/08/20  Hinda Kehr, MD     Allergies Red dye  Family History  Problem Relation Age of Onset   Diabetes Mother    Hypertension Mother  Cancer Maternal Aunt    Stroke Maternal Grandmother    Thyroid disease Maternal Grandmother    Breast cancer Paternal Grandmother    Hyperlipidemia Maternal Grandfather    Diabetes Maternal Grandfather    Chronic Renal Failure Maternal Grandfather    Heart Problems Maternal Grandfather        pacemaker    Hypertension Maternal Grandfather    Diabetes Paternal Grandfather    Heart Problems Brother     Asthma Brother     Social History Social History   Tobacco Use   Smoking status: Never   Smokeless tobacco: Never  Vaping Use   Vaping Use: Never used  Substance Use Topics   Alcohol use: No   Drug use: Not Currently    Types: Marijuana    Review of Systems  Constitutional: As above  ENT: Nasal congestion   Gastrointestinal: No abdominal pain.  No nausea, no vomiting.   Genitourinary: Negative for dysuria. Musculoskeletal: Positive myalgias Skin: Negative for rash. Neurological: Negative for headaches     ____________________________________________   PHYSICAL EXAM:  VITAL SIGNS: ED Triage Vitals  Enc Vitals Group     BP 11/21/20 1046 134/71     Pulse Rate 11/21/20 1046 (!) 104     Resp 11/21/20 1046 (!) 22     Temp 11/21/20 1046 (!) 100.5 F (38.1 C)     Temp Source 11/21/20 1046 Oral     SpO2 11/21/20 1046 98 %     Weight 11/21/20 1044 (!) 157.4 kg (347 lb)     Height 11/21/20 1044 1.778 m (5\' 10" )     Head Circumference --      Peak Flow --      Pain Score 11/21/20 1044 8     Pain Loc --      Pain Edu? --      Excl. in [place]? --      Constitutional: Alert and oriented. No acute distress. Pleasant and interactive Eyes: Conjunctivae are normal.  Head: Atraumatic. Nose: Positive congestion Mouth/Throat: Mucous membranes are moist.  Pharynx normal Cardiovascular: Normal rate, regular rhythm.  Respiratory: Normal respiratory effort.  No retractions.  Musculoskeletal: No lower extremity tenderness nor edema.   Neurologic:  Normal speech and language. No gross focal neurologic deficits are appreciated.   Skin:  Skin is warm, dry and intact. No rash noted.   ____________________________________________   LABS (all labs ordered are listed, but only abnormal results are displayed)  Labs Reviewed  RESP PANEL BY RT-PCR (FLU A&B, COVID) ARPGX2 - Abnormal; Notable for the following components:      Result Value   Influenza A by PCR POSITIVE (*)    All  other components within normal limits   ____________________________________________  EKG   ____________________________________________  RADIOLOGY   ____________________________________________   PROCEDURES  Procedure(s) performed: No  Procedures   Critical Care performed: No ____________________________________________   INITIAL IMPRESSION / ASSESSMENT AND PLAN / ED COURSE  Pertinent labs & imaging results that were available during my care of the patient were reviewed by me and considered in my medical decision making (see chart for details).   Patient presents with symptoms most consistent with upper respiratory viral illness, suspect influenza versus COVID, both are prevalent in the community at this time.  Overall patient is well-appearing and in no acute distress.  Recommend supportive care, PCR test sent, positive for influenza A   ____________________________________________   FINAL CLINICAL IMPRESSION(S) / ED DIAGNOSES  Final diagnoses:  Influenza-like illness  NEW MEDICATIONS STARTED DURING THIS VISIT:  Discharge Medication List as of 11/21/2020 11:29 AM     START taking these medications   Details  ondansetron (ZOFRAN ODT) 4 MG disintegrating tablet Take 1 tablet (4 mg total) by mouth every 8 (eight) hours as needed., Starting Thu 11/21/2020, Normal         Note:  This document was prepared using Dragon voice recognition software and may include unintentional dictation errors.    Lavonia Drafts, MD 11/21/20 [phone]

## 2020-11-21 NOTE — ED Triage Notes (Signed)
Pt to ED from [facility] for bodyaches and fevers x2 days. Febrile with EMS, 1000 mg tylenol given PTA Pt in NAD

## 2021-03-03 ENCOUNTER — Encounter: Payer: Medicaid Other | Admitting: Obstetrics and Gynecology

## 2021-03-03 DIAGNOSIS — Z124 Encounter for screening for malignant neoplasm of cervix: Secondary | ICD-10-CM

## 2021-03-03 DIAGNOSIS — Z01419 Encounter for gynecological examination (general) (routine) without abnormal findings: Secondary | ICD-10-CM

## 2021-03-04 ENCOUNTER — Encounter: Payer: Medicaid Other | Admitting: Obstetrics and Gynecology

## 2021-10-23 ENCOUNTER — Encounter: Payer: Self-pay | Admitting: Obstetrics and Gynecology

## 2021-11-15 IMAGING — US US OB FOLLOW-UP
1 series · 15 of 28 positions shown · non-contrast
Comparison: none

CLINICAL DATA: Large for gestational age fetus. Maternal obesity.
Follow-up fetal growth.

EXAM:
OBSTETRIC 14+ WK ULTRASOUND FOLLOW-UP

[Series 1: us ob follow up · 15 of 42 slices shown]
[im 1/42]
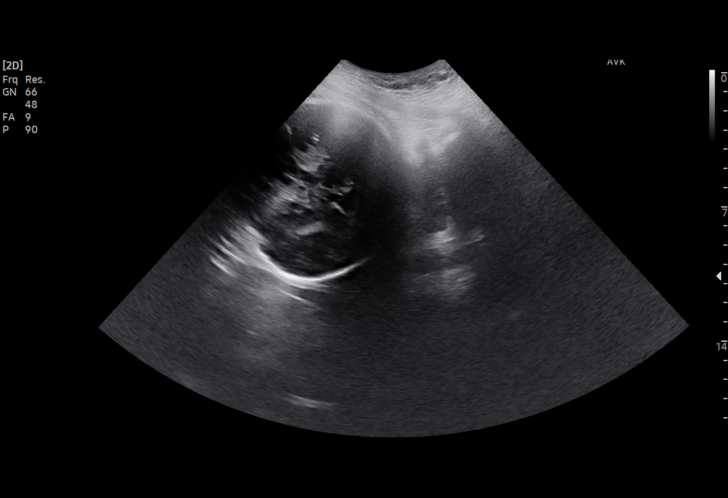
[im 4/42]
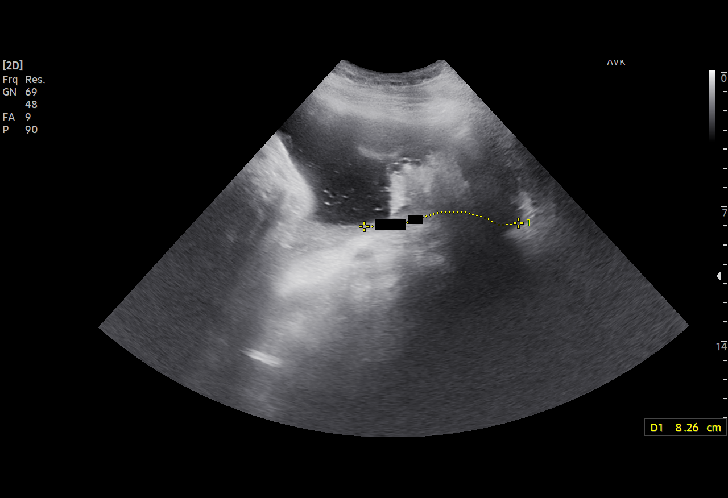
[im 7/42]
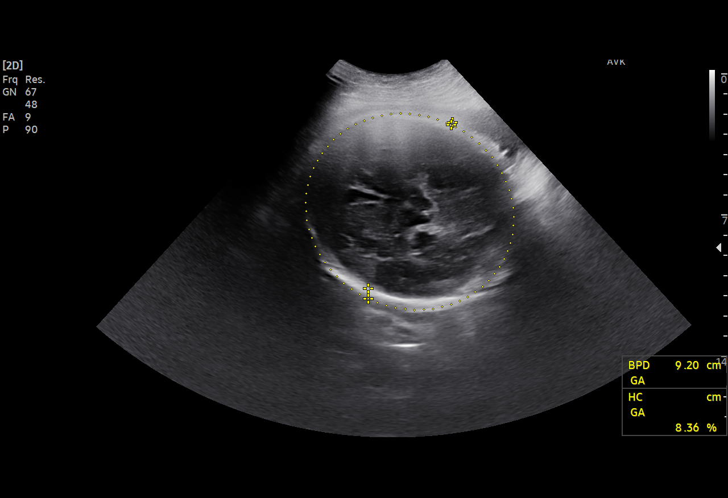
[im 10/42]
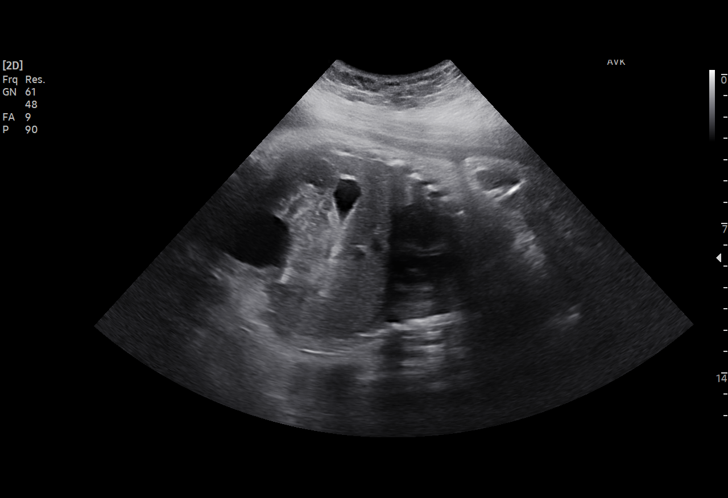
[im 13/42]
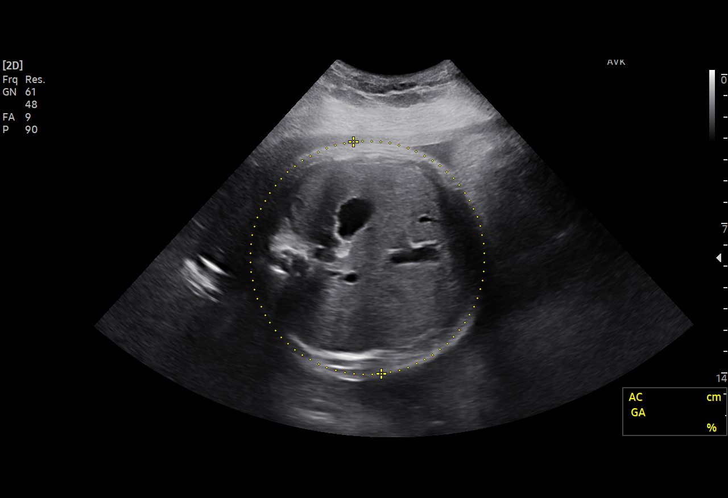
[im 16/42]
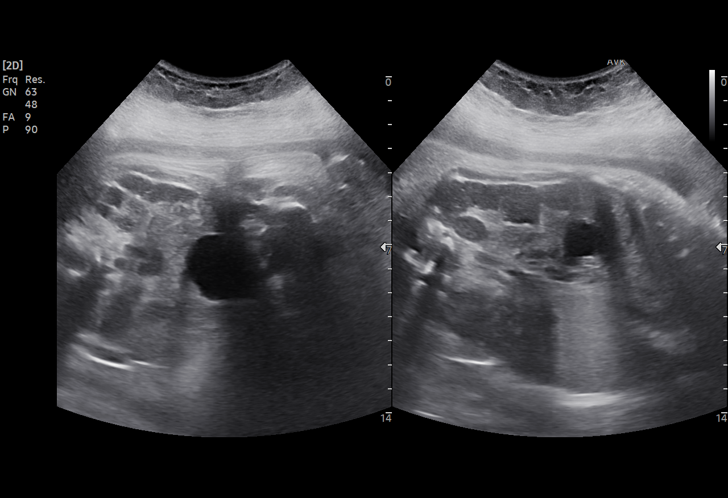
[im 19/42]
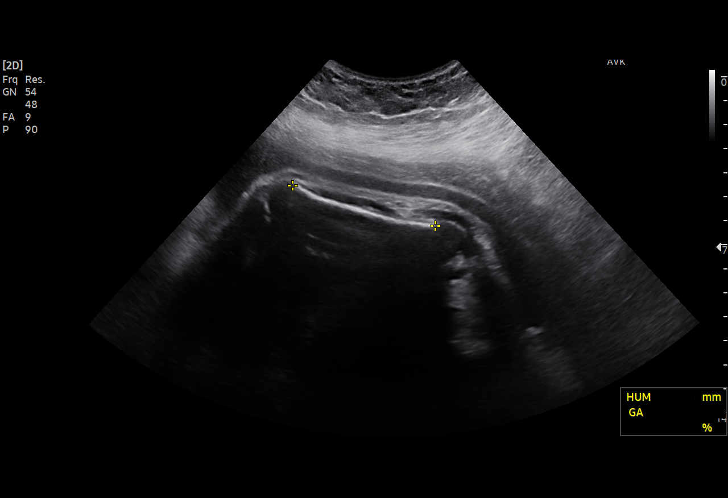
[im 22/42]
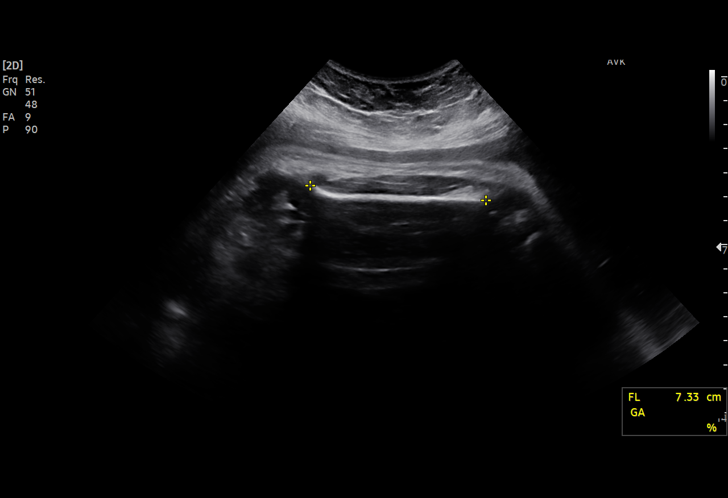
[im 23/42]
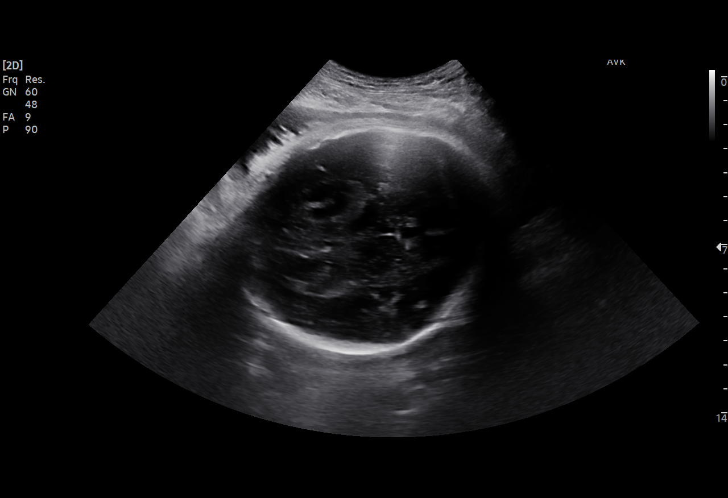
[im 26/42]
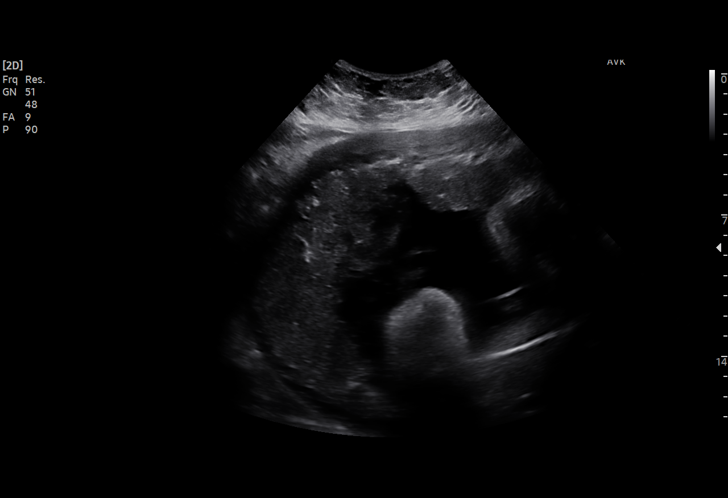
[im 29/42]
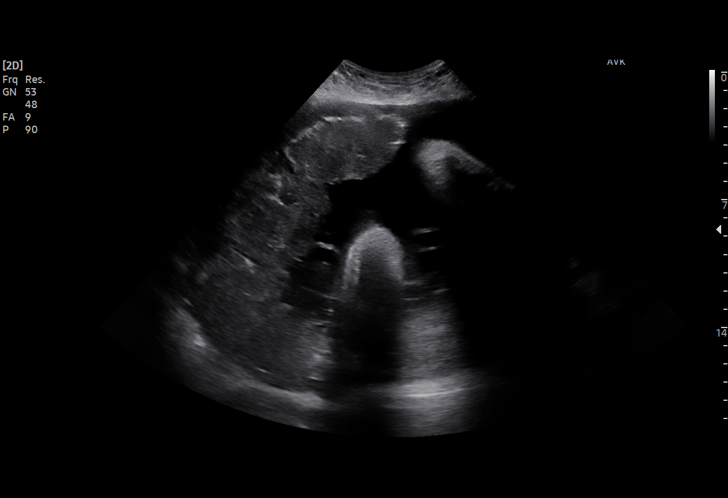
[im 32/42]
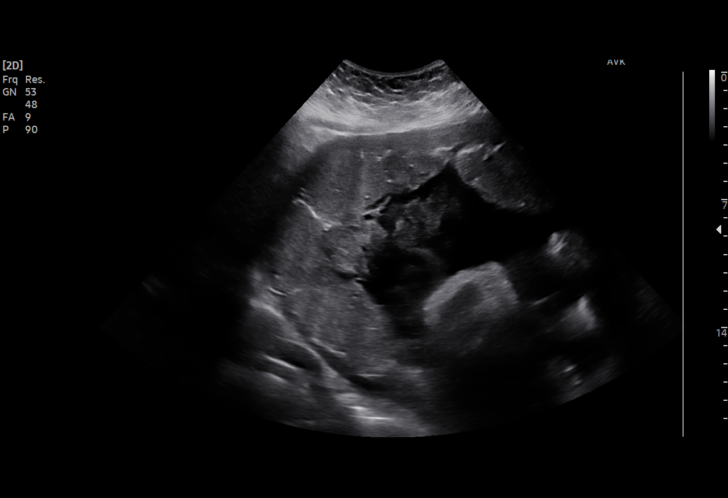
[im 35/42]
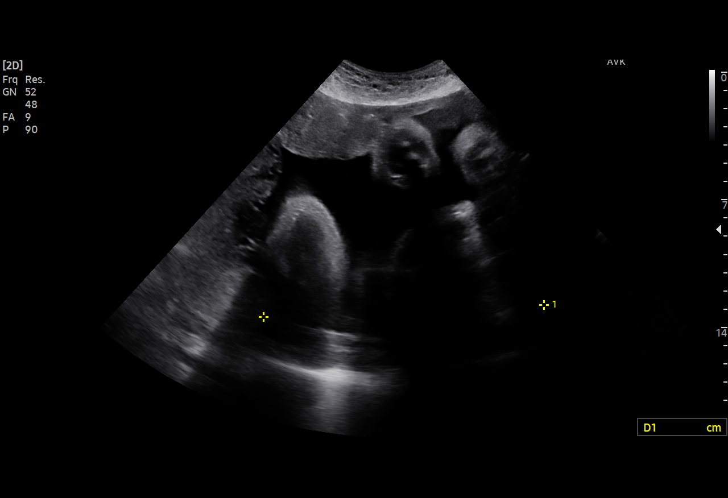
[im 38/42]
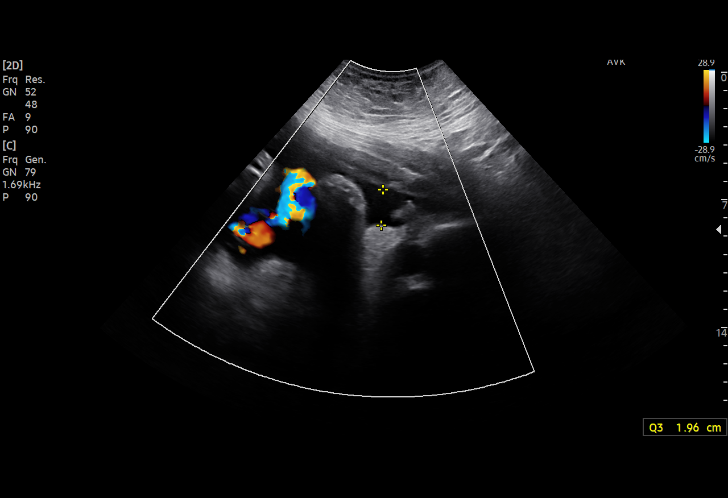
[im 42/42]
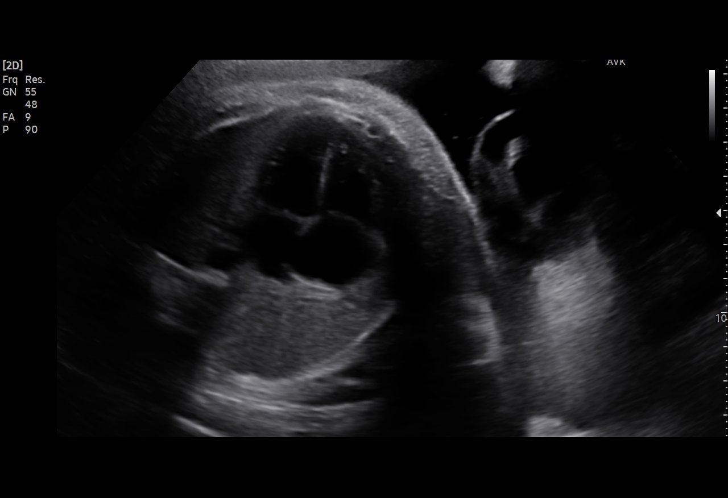

[15 of 28 positions shown; findings below may reference images not displayed]

FINDINGS: Number of Fetuses: 1

Heart Rate:  138 bpm

Movement: Yes

Presentation: Cephalic

Previa: No

Placental Location: Fundal and anterior

Amniotic Fluid (Subjective): Within normal limits

Amniotic Fluid (Objective):

AFI 14.7 cm (5%ile= 7.7 cm, 95%= 24.9 cm for 36 wks)

FETAL BIOMETRY

BPD:  9.2cm 37w 3d

HC:    31.4cm 35w 2d

AC:    33.7cm 37w 4d

FL:    7.4cm 37w 6d

Current Mean GA: 36w 5d US EDC: 07/19/2020

Assigned GA: 36w 0d Assigned EDC: 07/24/2020

Estimated Fetal Weight:  3,185g 86%ile

FETAL ANATOMY

Lateral Ventricles: Appears normal

Thalami/CSP: Appears normal

Posterior Fossa: Previously seen

Nuchal Region: Previously seen

Upper Lip: Previously seen

Spine: Previously seen

4 Chamber Heart on Left: Appears normal

LVOT: Previously seen

RVOT: Previously seen

Stomach on Left: Appears normal

3 Vessel Cord: Previously seen

Cord Insertion site: Previously seen

Kidneys: Appears normal

Bladder: Appears normal

Extremities: Previously seen

Sex: Previously Seen

Technical Limitations: Advanced gestational age and maternal habitus

Maternal Findings:

Cervix:  Not evaluated (>34 wks)
IMPRESSION: Assigned GA currently 36 weeks 0 days. Mild down trend in fetal
growth since prior study of 05/22/2020, currently at 86 %ile.

Amniotic fluid volume within normal limits, with AFI of 14.7 cm.

## 2022-03-02 IMAGING — CT CT ABD-PELV W/ CM
2 of 5 series · 15 of 46 positions shown, 17 images · IV contrast (APPLIED)
Comparison: CT the abdomen and pelvis 07/06/2017.

CLINICAL DATA: 19-year-old female with history of suspected bowel
obstruction, unable to defecate for 48 hours.

EXAM:
CT ABDOMEN AND PELVIS WITH CONTRAST
TECHNIQUE: Multidetector CT imaging of the abdomen and pelvis was performed
using the standard protocol following bolus administration of
intravenous contrast.
CONTRAST:  80mL OMNIPAQUE IOHEXOL 350 MG/ML SOLN

[Series 2: routine abd/pel with · axial · 0.87mm/px · z∈[-922,-488]mm · 12 of 101 slices shown, 14 images]
[im 7/101  soft-tissue]
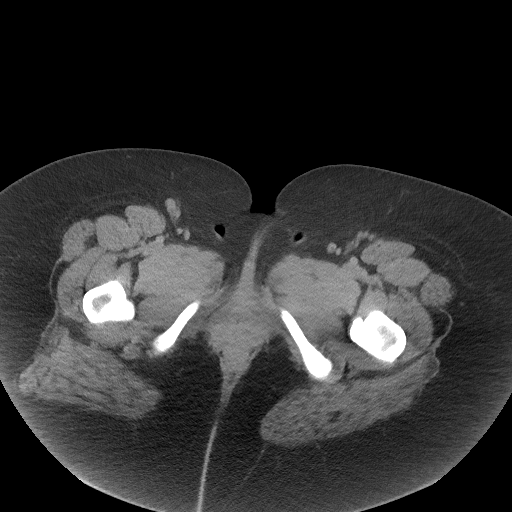
[im 7/101  bone]
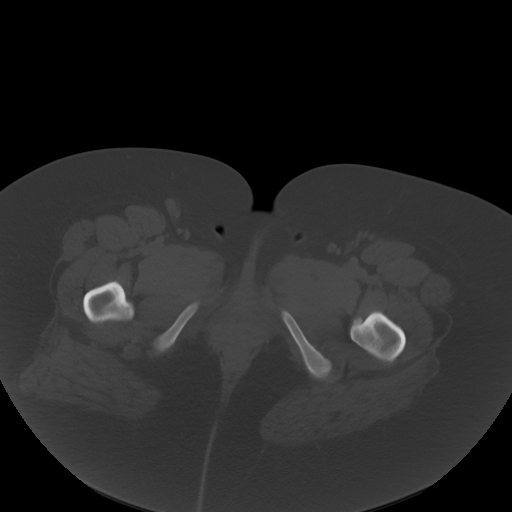
[im 14/101  soft-tissue]
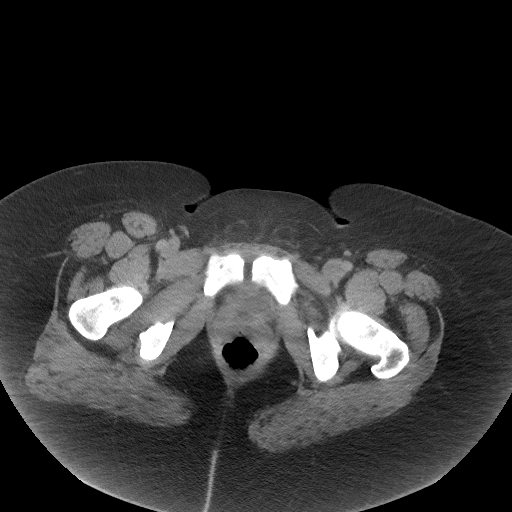
[im 21/101  soft-tissue]
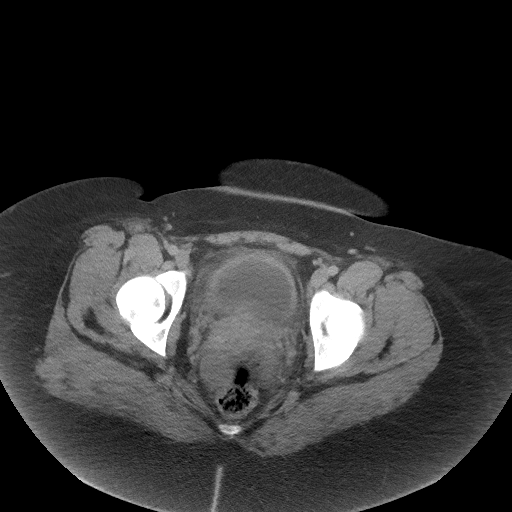
[im 34/101  soft-tissue]
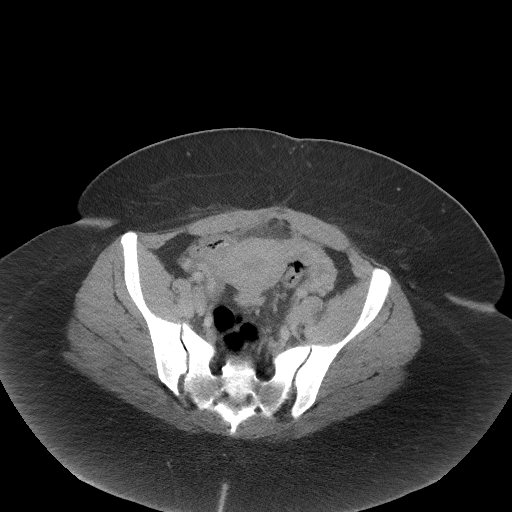
[im 41/101  soft-tissue]
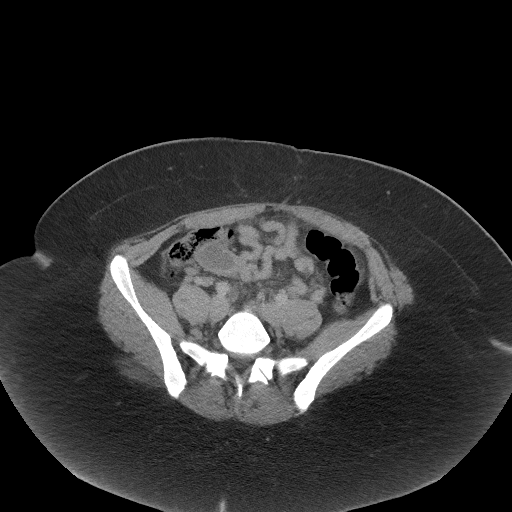
[im 47/101  soft-tissue]
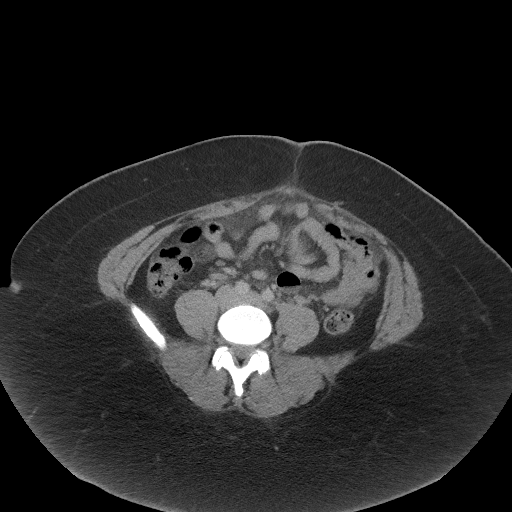
[im 54/101  soft-tissue]
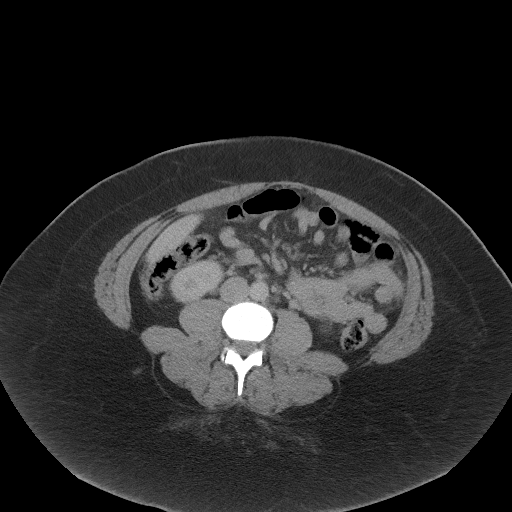
[im 61/101  soft-tissue]
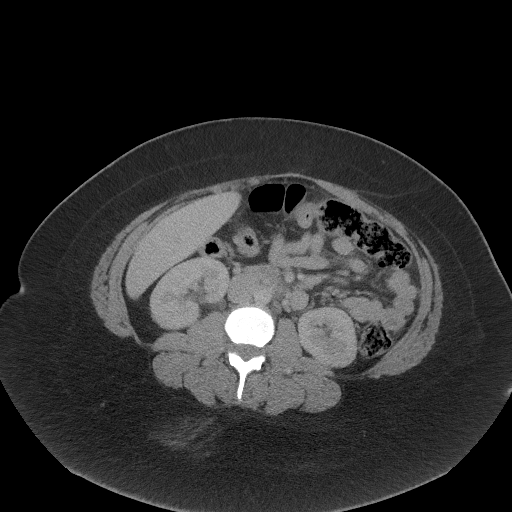
[im 67/101  soft-tissue]
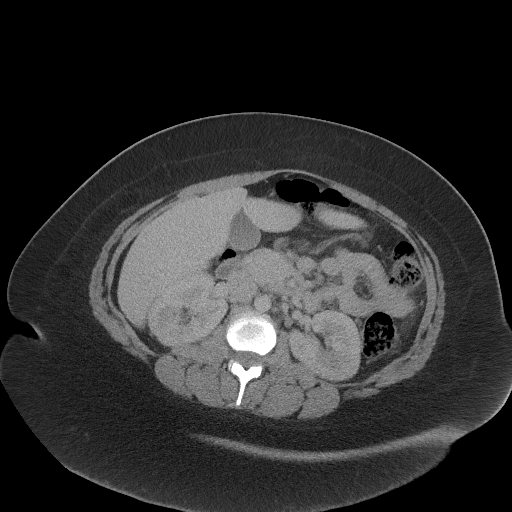
[im 67/101  bone]
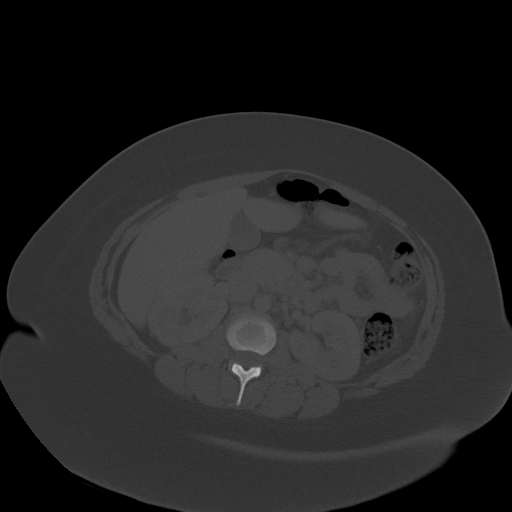
[im 81/101  soft-tissue]
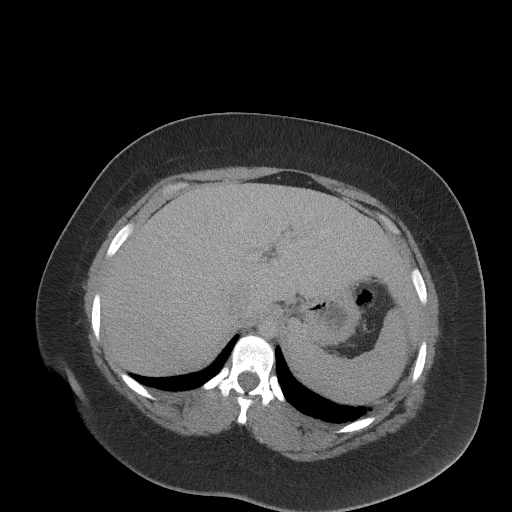
[im 87/101  soft-tissue]
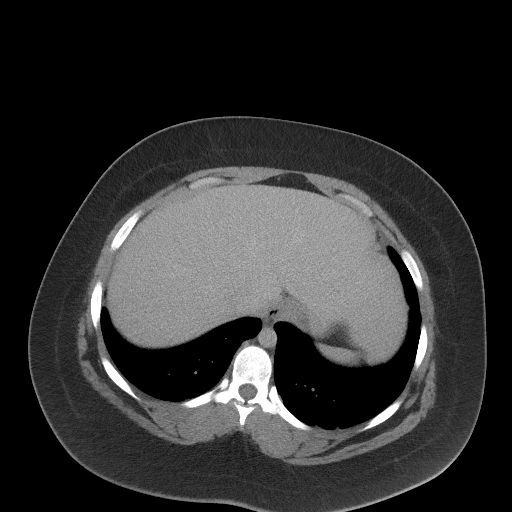
[im 94/101  soft-tissue]
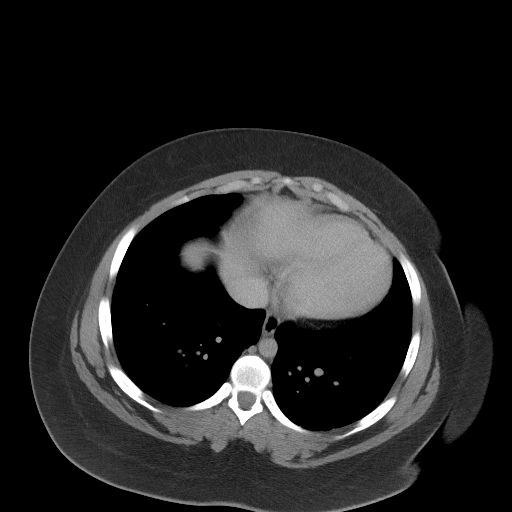

[Series 6: coronal st · coronal · 0.88mm/px · 3 of 122 slices shown]
[im 41/122  soft-tissue]
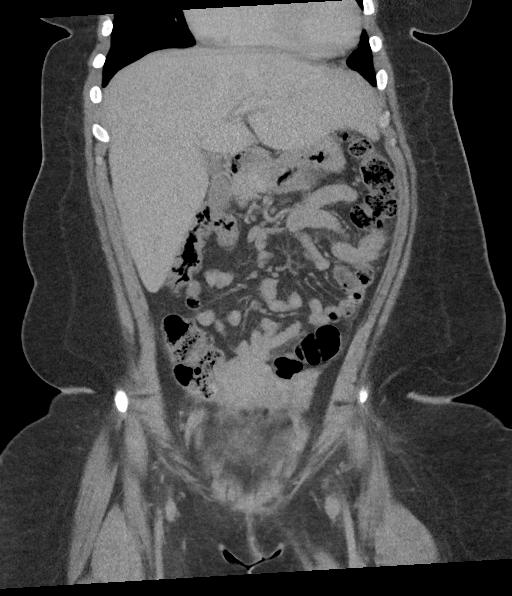
[im 54/122  soft-tissue]
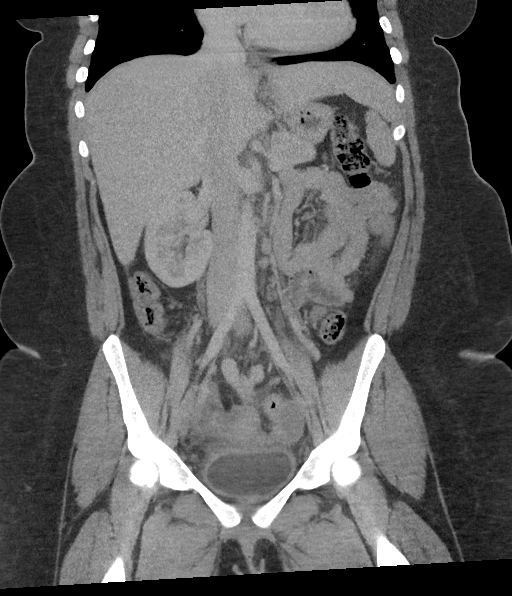
[im 68/122  soft-tissue]
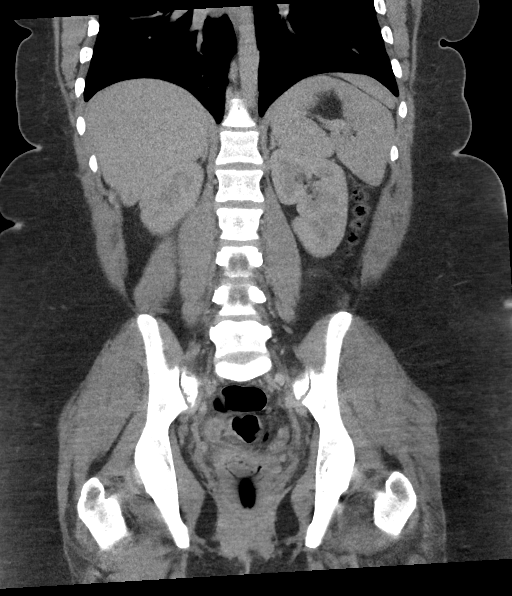

[15 of 46 positions shown; findings below may reference images not displayed]

FINDINGS: Lower chest: Mild scarring in the left lower lobe.

Hepatobiliary: No suspicious cystic or solid hepatic lesions. No
intra or extrahepatic biliary ductal dilatation. Gallbladder is
normal in appearance.

Pancreas: No pancreatic mass. No pancreatic ductal dilatation. No
pancreatic or peripancreatic fluid collections or inflammatory
changes.

Spleen: Unremarkable.

Adrenals/Urinary Tract: Bilateral kidneys and bilateral adrenal
glands are normal in appearance. No hydroureteronephrosis. Urinary
bladder is unremarkable in appearance. Haziness in the perivesical
fat.

Stomach/Bowel: Unenhanced appearance of the stomach is normal. There
is no pathologic dilatation of small bowel or colon. Stool burden
does not appear excessive. Normal appendix.

Vascular/Lymphatic: No significant atherosclerotic disease, aneurysm
or dissection noted in the abdominal or pelvic vasculature. No
lymphadenopathy noted in the abdomen or pelvis.

Reproductive: Uterus and ovaries are unremarkable in appearance.
Haziness in the adnexal regions bilaterally.

Other: Mild diffuse haziness in the small bowel mesentery in the
pelvis, as well as the inferior aspect of the omentum which extends
to the pelvis. Haziness in perivesical fat. Small fluid collections
which appear loculated are noted in the cul-de-sac (axial image 79
of series 2 and coronal image 79 of series 6) measuring 6.8 x 4.0 x
4.3 cm, and in the right lower quadrant superior and lateral to the
urinary bladder (axial image 66 of series 2 and coronal image 49 of
series 6) measuring 4.6 x 2.4 x 2.7 cm. Both of these demonstrate a
small amount of rim enhancement. No internal gas within either of
these collections. Trace volume of ascites. No pneumoperitoneum.

Musculoskeletal: There are no aggressive appearing lytic or blastic
lesions noted in the visualized portions of the skeleton.
IMPRESSION: 1. Extensive inflammation in the low anatomic pelvis where there are
2 loculated fluid collections which demonstrate a small amount of
rim enhancement. Given the patient's history of prior C-section on
07/19/2020, findings are concerning for widespread pelvic
phlegmonous inflammation and possible infected postoperative fluid
collections. Alternatively, the possibility of an unrelated etiology
such as pelvic inflammatory disease (PID) could be considered.
Further clinical evaluation is recommended.
2. Normal appendix.
3. Stool burden does not appear excessive to suggest constipation.

## 2022-11-29 NOTE — ED Provider Notes (Signed)
 [facility] EMERGENCY DEPARTMENT  ED Provider Note History   Chief Complaint  Patient presents with  . Foot Pain   History of Present Illness Joanna [name] is a 22 y.o. female who presents to the ED for evaluation of right plantar foot pain.  Has been having pain bottom of foot for past 1 wk after standing on feet for 10 hours per day.  Has work shoes that have too much arch in them. No trauma.  No rash.  Pain radiates to medial of heel.   Past Medical History:  Diagnosis Date  . Asthma without status asthmaticus ([facility]HS-HCC)    Past Surgical History:  Procedure Laterality Date  . ADENOIDECTOMY    . TONSILLECTOMY     Family History  Problem Relation Age of Onset  . Diabetes type II Mother   . High blood pressure (Hypertension) Mother   . Coronary Artery Disease (Blocked arteries around heart) Father    Social History   Socioeconomic History  . Marital status: Unknown  Tobacco Use  . Smoking status: Never  Substance and Sexual Activity  . Alcohol use: No  . Sexual activity: Defer   Review of Systems  Musculoskeletal:  Positive for arthralgias.  All other systems reviewed and are negative.   Physical Exam  BP (!) 148/75 (BP Location: Right forearm, Patient Position: Sitting)   Pulse 73   Temp 36.3 C (97.3 F) (Oral)   Resp 14   LMP 11/15/2022   SpO2 100%   Physical Exam Vitals and nursing note reviewed.  Constitutional:      Appearance: Normal appearance.  Musculoskeletal:     Right foot: Normal range of motion and normal capillary refill. Tenderness (plantar arch and insertion at calcaneous) present. Normal pulse.       Feet:  Skin:    Coloration: Skin is not jaundiced.     Findings: No erythema or rash.  Neurological:     General: No focal deficit present.     Mental Status: She is alert and oriented to person, place, and time.     Sensory: No sensory deficit.     Motor: No weakness.     Procedures  Procedures TIP (no need to delete)  Delete the  Procedures section if patient does not have a procedure.  Medical Decision Making and ED Course  Given the patient's initial exam and evaluation, the following diagnostic evaluation has been ordered. The patient will be placed in the appropriate treatment space, once one is available to complete the evaluation and treatment. I have discussed the plan of care with the patient/representative and have advised that the plan of care may change based upon the results of diagnostic testing. The patient/representative has been asked to not leave prior to the completion of their evaluation. I have advised that if they leave prior to the completion of their evaluation, they will be leaving against medical advice.  22 y/o with atraumatic right foot pain.  Exam c/w plantar fasciitis.  Will check xrays to ensure no acute concern, otherwise.   Medical Complexity:  [x] New and requires workup. [] New and does not require workup. [] Pertinent labs & imaging results were reviewed by me and considered in my decision making. [] I obtained history from someone other than the patient. [] I reviewed previous medical records. [x] I independently visualized image(s), tracing(s), and/or specimen(s). [] I discussed the patient with another provider.     Results:  LABS: No results found for this or any previous visit (from the past 24 hours).  IMAGING: Notified that Radiology will read radiologic studies and patient will be called if change in report by radiology department. X-ray foot right 3 plus views    (Results Pending)   My imaging read: no fracture, foreign body, etc.   ED Course as of 11/29/22 2102  Sun Nov 29, 2022  2101 X-ray foot right 3 plus views No fracture or foreign body.  Discussed using new arch supports in her shoes, topical nonsteroidal, stretching exercises of the foot. [TR]    ED Course User Index [TR] Sharl Krystal Dawn, MD     ED Clinical Impression  1. Plantar fasciitis of right foot       ED Disposition  Discharge     Follow-up  Neill Krystal Fallow, [place] [address] [place] [address] [phone]   As needed       This note was partially written using Dragon (a voice recognition system) and may contain typographical errors missed during proofreading. TIP (no need to delete)  Click Refresh button to add wrap-up information to note.     Sharl Krystal Dawn, MD 11/29/22 [phone]

## 2023-04-05 ENCOUNTER — Ambulatory Visit: Admitting: Certified Nurse Midwife

## 2023-04-05 DIAGNOSIS — Z01419 Encounter for gynecological examination (general) (routine) without abnormal findings: Secondary | ICD-10-CM

## 2023-04-05 DIAGNOSIS — Z124 Encounter for screening for malignant neoplasm of cervix: Secondary | ICD-10-CM

## 2023-04-05 DIAGNOSIS — Z113 Encounter for screening for infections with a predominantly sexual mode of transmission: Secondary | ICD-10-CM

## 2023-04-23 ENCOUNTER — Ambulatory Visit: Admitting: Certified Nurse Midwife

## 2023-05-01 ENCOUNTER — Emergency Department
Admission: EM | Admit: 2023-05-01 | Discharge: 2023-05-01 | Disposition: A | Discharge status: Home / Self Care | Attending: Emergency Medicine | Admitting: Emergency Medicine

## 2023-05-01 ENCOUNTER — Other Ambulatory Visit: Payer: Self-pay

## 2023-05-01 DIAGNOSIS — M545 Low back pain, unspecified: Secondary | ICD-10-CM | POA: Diagnosis present

## 2023-05-01 DIAGNOSIS — Y92410 Unspecified street and highway as the place of occurrence of the external cause: Secondary | ICD-10-CM | POA: Diagnosis not present

## 2023-05-01 LAB — POC URINE PREG, ED: Preg Test, Ur: NEGATIVE

## 2023-05-01 MED ORDER — MELOXICAM 15 MG PO TABS: 15.0000 mg | ORAL_TABLET | Freq: Every day | ORAL | 0 refills | Status: AC

## 2023-05-01 MED ORDER — TIZANIDINE HCL 4 MG PO TABS: 4.0000 mg | ORAL_TABLET | Freq: Three times a day (TID) | ORAL | 1 refills | Status: DC

## 2023-05-01 MED ORDER — KETOROLAC TROMETHAMINE 30 MG/ML IJ SOLN
30.0000 mg | Freq: Once | INTRAMUSCULAR | Status: AC
  Administered 2023-05-01: 30 mg via INTRAMUSCULAR
  Filled 2023-05-01: qty 1

## 2023-05-01 MED ORDER — ACETAMINOPHEN 325 MG PO TABS
650.0000 mg | ORAL_TABLET | Freq: Once | ORAL | Status: AC
  Administered 2023-05-01: 650 mg via ORAL
  Filled 2023-05-01: qty 2

## 2023-05-01 MED ORDER — CYCLOBENZAPRINE HCL 10 MG PO TABS
5.0000 mg | ORAL_TABLET | Freq: Once | ORAL | Status: AC
  Administered 2023-05-01: 5 mg via ORAL
  Filled 2023-05-01: qty 1
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
## 2023-05-01 NOTE — Discharge Instructions (Addendum)
 Follow-up with your primary care provider as needed.  Podiatry has been added.  Call and schedule appointment with Dr. Michalene Agee for plantar fasciitis evaluation.
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
## 2023-05-06 ENCOUNTER — Encounter: Payer: Self-pay | Admitting: Certified Nurse Midwife

## 2023-06-02 ENCOUNTER — Emergency Department
Admission: EM | Admit: 2023-06-02 | Discharge: 2023-06-02 | Disposition: A | Discharge status: Home / Self Care | Attending: Emergency Medicine | Admitting: Emergency Medicine

## 2023-06-02 ENCOUNTER — Encounter: Payer: Self-pay | Admitting: Emergency Medicine

## 2023-06-02 ENCOUNTER — Other Ambulatory Visit: Payer: Self-pay

## 2023-06-02 DIAGNOSIS — S39012A Strain of muscle, fascia and tendon of lower back, initial encounter: Secondary | ICD-10-CM

## 2023-06-02 DIAGNOSIS — M545 Low back pain, unspecified: Secondary | ICD-10-CM | POA: Diagnosis present

## 2023-06-02 DIAGNOSIS — R110 Nausea: Secondary | ICD-10-CM

## 2023-06-02 LAB — COMPREHENSIVE METABOLIC PANEL WITH GFR
ALT: 20 U/L (ref 0–44)
AST: 21 U/L (ref 15–41)
Albumin: 3.6 g/dL (ref 3.5–5.0)
Alkaline Phosphatase: 54 U/L (ref 38–126)
Anion gap: 7 (ref 5–15)
BUN: 16 mg/dL (ref 6–20)
CO2: 25 mmol/L (ref 22–32)
Calcium: 9 mg/dL (ref 8.9–10.3)
Chloride: 104 mmol/L (ref 98–111)
Creatinine, Ser: 0.66 mg/dL (ref 0.44–1.00)
GFR, Estimated: >60 mL/min (ref >60)
Glucose, Bld: 107 mg/dL — ABNORMAL HIGH (ref 70–99)
Potassium: 4 mmol/L (ref 3.5–5.1)
Sodium: 136 mmol/L (ref 135–145)
Total Bilirubin: 0.8 mg/dL (ref 0.0–1.2)
Total Protein: 7.1 g/dL (ref 6.5–8.1)

## 2023-06-02 LAB — URINALYSIS, ROUTINE W REFLEX MICROSCOPIC
Bilirubin Urine: NEGATIVE
Glucose, UA: NEGATIVE mg/dL
Hgb urine dipstick: NEGATIVE
Ketones, ur: NEGATIVE mg/dL
Leukocytes,Ua: NEGATIVE
Nitrite: NEGATIVE
Protein, ur: NEGATIVE mg/dL
Specific Gravity, Urine: 1.023 (ref 1.005–1.030)
pH: 6 (ref 5.0–8.0)

## 2023-06-02 LAB — CBC
HCT: 38.3 % (ref 36.0–46.0)
Hemoglobin: 12 g/dL (ref 12.0–15.0)
MCH: 26.1 pg (ref 26.0–34.0)
MCHC: 31.3 g/dL (ref 30.0–36.0)
MCV: 83.3 fL (ref 80.0–100.0)
Platelets: 254 10*3/uL (ref 150–400)
RBC: 4.6 MIL/uL (ref 3.87–5.11)
RDW: 16.7 % — ABNORMAL HIGH (ref 11.5–15.5)
WBC: 5.9 10*3/uL (ref 4.0–10.5)
nRBC: 0 % (ref 0.0–0.2)

## 2023-06-02 LAB — LIPASE, BLOOD: Lipase: 24 U/L (ref 11–51)

## 2023-06-02 LAB — PREGNANCY, URINE: Preg Test, Ur: NEGATIVE

## 2023-06-02 MED ORDER — ONDANSETRON 4 MG PO TBDP: 4.0000 mg | ORAL_TABLET | Freq: Three times a day (TID) | ORAL | 0 refills | Status: DC | PRN

## 2023-06-02 MED ORDER — METHOCARBAMOL 500 MG PO TABS: 500.0000 mg | ORAL_TABLET | Freq: Two times a day (BID) | ORAL | 0 refills | Status: DC | PRN

## 2023-06-02 NOTE — ED Triage Notes (Signed)
 Patient to ED via POV for lower bad pain with diarrhea and lower back pain. Ongoing x1 week.

## 2023-06-02 NOTE — ED Provider Notes (Signed)
   [facility] Provider Note    Event Date/Time   First MD Initiated Contact with Patient 06/02/23 1342     (approximate)  History   Chief Complaint: Abdominal Pain  HPI  Joanna [name] is a 23 y.o. female with a past medical history of anemia, anxiety, bipolar, gastric reflux, presents to the emergency department with complaints of lower back pain as well as nausea.  According to the patient for the past week or so she has been experiencing pain in lower back that is worse with movement.  Also states she has been nauseated at times.  Patient denies any vomiting.  No urinary symptoms.  No fever.  Physical Exam   Triage Vital Signs: ED Triage Vitals  Encounter Vitals Group     BP 06/02/23 1252 (!) 145/88     Systolic BP Percentile --      Diastolic BP Percentile --      Pulse Rate 06/02/23 1252 (!) 102     Resp 06/02/23 1252 17     Temp 06/02/23 1252 98.2 F (36.8 C)     Temp Source 06/02/23 1252 Oral     SpO2 06/02/23 1252 98 %     Weight 06/02/23 1251 (!) 312 lb (141.5 kg)     Height 06/02/23 1251 5\' 10"  (1.778 m)     Head Circumference --      Peak Flow --      Pain Score 06/02/23 1251 7     Pain Loc --      Pain Education --      Exclude from Growth Chart --     Most recent vital signs: Vitals:   06/02/23 1252  BP: (!) 145/88  Pulse: (!) 102  Resp: 17  Temp: 98.2 F (36.8 C)  SpO2: 98%    General: Awake, no distress.  CV:  Good peripheral perfusion.  Regular rate and rhythm  Resp:  Normal effort.  Equal breath sounds bilaterally.  Abd:  No distention.  Soft, nontender.  No rebound or guarding. Other:  Mild lumbar paraspinal tenderness to palpation.   ED Results / Procedures / Treatments   MEDICATIONS ORDERED IN ED: Medications - No data to display   IMPRESSION / MDM / ASSESSMENT AND PLAN / ED COURSE  I reviewed the triage vital signs and the nursing notes.  Patient's presentation is most consistent with acute presentation  with potential threat to life or bodily function.  Patient presents emergency department for lower back pain worse with movement along with some nausea at times.  Denies any anterior abdominal pain.  Denies any urinary symptoms.  Denies any fever, states nausea but denies vomiting.  No diarrhea.  Patient's workup is overall reassuring normal CBC, reassuring chemistry including normal LFTs, normal lipase, normal urinalysis and a negative pregnancy test.  Patient's low back pain is very reproducible with movement, suspect lumbar strain.  Will place the patient on a short course of a muscle relaxer as well as Zofran  if needed for nausea.  Will have the patient follow-up with her doctor.  Patient agreeable to plan of care.  Patient's workup is otherwise reassuring, physical exam reassuring with no abdominal tenderness to palpation.  FINAL CLINICAL IMPRESSION(S) / ED DIAGNOSES   Low back pain Nausea   Note:  This document was prepared using Dragon voice recognition software and may include unintentional dictation errors.   Ruth Cove, MD 06/02/23 1409

## 2023-09-06 ENCOUNTER — Ambulatory Visit (LOCAL_COMMUNITY_HEALTH_CENTER): Payer: Self-pay

## 2023-09-06 VITALS — BP 150/75 | HR 61 | Ht 71.0 in | Wt 374.6 lb

## 2023-09-06 DIAGNOSIS — R030 Elevated blood-pressure reading, without diagnosis of hypertension: Secondary | ICD-10-CM

## 2023-09-06 DIAGNOSIS — Z124 Encounter for screening for malignant neoplasm of cervix: Secondary | ICD-10-CM

## 2023-09-06 DIAGNOSIS — Z113 Encounter for screening for infections with a predominantly sexual mode of transmission: Secondary | ICD-10-CM

## 2023-09-06 DIAGNOSIS — Z3009 Encounter for other general counseling and advice on contraception: Secondary | ICD-10-CM

## 2023-09-06 DIAGNOSIS — B9689 Other specified bacterial agents as the cause of diseases classified elsewhere: Secondary | ICD-10-CM

## 2023-09-06 LAB — WET PREP FOR TRICH, YEAST, CLUE
Clue Cell Exam: POSITIVE — AB
Trichomonas Exam: NEGATIVE
Yeast Exam: NEGATIVE

## 2023-09-06 LAB — HM HIV SCREENING LAB: HM HIV Screening: NEGATIVE

## 2023-09-06 MED ORDER — METRONIDAZOLE 500 MG PO TABS
500.0000 mg | ORAL_TABLET | Freq: Two times a day (BID) | ORAL | Status: AC
Start: 2023-09-06 — End: 2023-09-13

## 2023-09-06 NOTE — Progress Notes (Signed)
 Pt here for annual physical, pap smear and STI screening.  Wet mount results reviewed with patient.  Results positive for BV.  Metronidazole  500mg  #14 dispensed to patient.  Counseled re medication, side efffects, plan of care and when to contact clinic with questions or concerns.  Verbalizes understanding.  PCP and dental lists provided.  Condoms declined.-Lars Jeziorski, RN

## 2023-09-06 NOTE — Progress Notes (Signed)
 [employer] [facility] [address], [address] [place] [address] Main phone: [phone]  Family Planning Visit - Repeat Yearly Visit  Subjective:  Joanna [name] is a 23 y.o. G2P2002  being seen today for an annual wellness visit and to discuss contraception options. The patient is currently using female condom for pregnancy prevention. Patient does not want a pregnancy in the next year.   Patient has the following medical problems:  Patient Active Problem List   Diagnosis Date Noted   History of macrosomia in infant in prior pregnancy, currently pregnant 06/21/2020   Personal history of COVID-19 06/21/2020   COVID-19 affecting pregnancy in third trimester    H/O migraine during pregnancy 06/04/2020   Anemia of pregnancy in third trimester 05/06/2020   Postoperative wound infection    Sepsis (HCC) 02/03/2017   Morbid obesity with BMI of 50.0-59.9, adult (HCC) 07/09/2016   Asthma 09/27/2012   Chief Complaint  Patient presents with   Annual Exam    Physical, pap smear and STI screening   HPI Patient reports need for pap, STI testing.   Review of Systems  All other systems reviewed and are negative.  See flowsheet for further details and programmatic requirements Hyperlink available at the top of the signed note in blue.  Flow sheet content below:  Pregnancy Intention Screening Does the patient want to become pregnant in the next year?: No Does the patient's partner want to become pregnant in the next year?: No Would the patient like to discuss contraceptive options today?: No Other:  Password: arie Is it okay to contact you by mail?: Yes Difficulty accessing hygiene products (feminine products/soap) in the last 3 months: No Contraception History Past methods of contraception used by patient:: Intrauterine device or system (IDU,IUS), Contraceptive Pill Adverse effects associated with Contraceptive Pill:  nausea Adverse effects associated with Intrauterine device or system (IUD,IUS): got pregnant Sexual History What age did you start your period?: 10 How often do you have your period?: monthly Date of last sex?: 09/04/23 Has the patient had unprotected sex within the last 5 days?: Yes Do you have sex with men, women, both men and women?: Men only In the past 2 months how many partners have you had sex with?: 1 In the past 12 months, how many partners have you had sex with?: 2 Is it possible that any of your sex partners in the past 12 months had sex with someone else whild they were still in a sexual relationship with you?: Yes Do you or your partner use condoms and/or dental dams every time you have vaginal, oral or anal sex?: No Do you douche?: No Date of last HIV test?:  (during pregnancy) Have you ever had an STD?: No Have any of your partners had an STD?: No Have you or your partner ever shot up drugs?: No Have any of your partners used drugs in the past?: No Have you or your partners exchanged money or drugs for sex?: No Risk Factors for Hep B Household, sexual, or needle sharing contact of a person infected with Hep B: No Sexual contact with a person who uses drugs not as prescribed?: No Currently or Ever used drugs not as prescribed: No HIV Positive: No PRep Patient: No Men who have sex with men: No Have Hepatitis C: No History of Incarceration: No History of Homeslessness?: No Anal sex following anal drug use?: No Risk Factors for Hep C Currently using drugs not as prescribed: No  Sexual partner(s) currently using drugs as not prescribed: No History of drug use: No HIV Positive: No People with a history of incarceration: No People born between the years of 12 and 38: No  Diabetes screening This patient is 23 y.o. with a BMI of Body mass index is 52.25 kg/m.Joanna [name]  Is patient eligible for diabetes screening (age >35 and BMI >25)?  no  Was Hgb A1c ordered? no  STI  screening Patient reports 2 of partners in last year.  Does this patient desire STI screening?  Yes  Hepatitis C screening Has patient been screened once for HCV in the past?  No  No results found for: HCVAB  Does the patient meet criteria for HCV testing? No  (If yes-- Screen for HCV through [facility]) Criteria:  Since the last HCV result, does the patient have any of the following? - Current drug use - Have a partner with drug use - Has been incarcerated  Hepatitis B screening Does the patient meet criteria for HBV testing? No Criteria:  -Household, sexual or needle sharing contact with HBV -History of drug use -HIV positive -Those with known Hep C  Cervical Cancer Screening  Screening today  Health Maintenance Due  Topic Date Due   Pneumococcal Vaccine (1 of 1 - PPSV23, PCV20, or PCV21) 10/30/2005   HPV VACCINES (2 - 2-dose series) 02/13/2012   Meningococcal B Vaccine (1 of 2 - Standard) Never done   Cervical Cancer Screening (Pap smear)  Never done   CHLAMYDIA SCREENING  10/11/2021   COVID-19 Vaccine (3 - 2024-25 season) 09/13/2022   INFLUENZA VACCINE  08/13/2023   The following portions of the patient's history were reviewed and updated as appropriate: allergies, current medications, past family history, past medical history, past social history, past surgical history and problem list. Problem list updated.  Objective:   Vitals:   09/06/23 1258  BP: (!) 150/75  Pulse: 61  Weight: (!) 374 lb 9.6 oz (169.9 kg)  Height: 5' 11 (1.803 m)   Physical Exam Vitals and nursing note reviewed. Exam conducted with a chaperone present Brett Orange).  Constitutional:      Appearance: Normal appearance. She is obese.  HENT:     Head: Normocephalic and atraumatic.     Mouth/Throat:     Mouth: Mucous membranes are moist.     Pharynx: Oropharynx is clear. No oropharyngeal exudate or posterior oropharyngeal erythema.  Pulmonary:     Effort: Pulmonary effort is normal.   Genitourinary:    General: Normal vulva.     Exam position: Lithotomy position.     Pubic Area: No rash or pubic lice.      Labia:        Right: No rash or lesion.        Left: No rash or lesion.      Vagina: Vaginal discharge present. No erythema, bleeding or lesions.     Cervix: No cervical motion tenderness, discharge, friability, lesion or erythema.  Lymphadenopathy:     Head:     Right side of head: No preauricular or posterior auricular adenopathy.     Left side of head: No preauricular or posterior auricular adenopathy.     Cervical: No cervical adenopathy.     Upper Body:     Right upper body: No supraclavicular, axillary or epitrochlear adenopathy.     Left upper body: No supraclavicular, axillary or epitrochlear adenopathy.     Lower Body: No right inguinal adenopathy. No left inguinal  adenopathy.  Skin:    General: Skin is warm and dry.     Findings: No rash.  Neurological:     Mental Status: She is alert and oriented to person, place, and time.    Assessment and Plan:  Joanna [name] is a 23 y.o. female G2P2002 presenting to the [facility] Department for an yearly wellness and contraception visit  Family planning  Contraception counseling:  Reviewed options based on patient desire and reproductive life plan. Patient is interested in Female Sterilization. This was not provided to the patient today. Patient declined referral for today, but will consider in future. Declines other forms of contraception today.  Risks, benefits, and typical effectiveness rates were reviewed.  Questions were answered.  Written information was also given to the patient to review.    The patient will follow up in  1 years for surveillance.  The patient was told to call with any further questions, or with any concerns about this method of contraception.  Emphasized use of condoms 100% of the time for STI prevention.  2. Screening for venereal disease (Primary)  - WET PREP  FOR TRICH, YEAST, CLUE - Chlamydia/Gonorrhea [facility] - HIV [place] - Syphilis Serology, [facility]  3. Screening for cervical cancer  - IGP, rfx Aptima HPV ASCU  4. Elevated blood pressure reading  - CNA intended to repeat at end of visit, patient left before she was able to - Gave PCP list to patient and discussed that systolic number was high  5. Bacterial vaginosis  - Wet prep positive for clue and amine, patient desires treatment - metroNIDAZOLE  (FLAGYL ) 500 MG tablet; Take 1 tablet (500 mg total) by mouth 2 (two) times daily for 7 days.   Return as needed for STI testing, contraception.  No future appointments.  Damien FORBES Satchel, NP

## 2023-09-09 ENCOUNTER — Ambulatory Visit: Payer: Self-pay

## 2023-09-09 LAB — IGP, RFX APTIMA HPV ASCU: PAP Smear Comment: 0

## 2023-09-09 NOTE — Progress Notes (Signed)
 Pap is negative/NILM. Repeat in 3 years per [facility].  Damien Satchel NP

## 2023-11-17 ENCOUNTER — Other Ambulatory Visit: Payer: Self-pay

## 2023-11-17 DIAGNOSIS — R102 Pelvic and perineal pain unspecified side: Secondary | ICD-10-CM | POA: Insufficient documentation

## 2023-11-17 DIAGNOSIS — N939 Abnormal uterine and vaginal bleeding, unspecified: Secondary | ICD-10-CM | POA: Insufficient documentation

## 2023-11-17 LAB — BASIC METABOLIC PANEL WITH GFR
Anion gap: 10 (ref 5–15)
BUN: 13 mg/dL (ref 6–20)
CO2: 23 mmol/L (ref 22–32)
Calcium: 8.6 mg/dL — ABNORMAL LOW (ref 8.9–10.3)
Chloride: 106 mmol/L (ref 98–111)
Creatinine, Ser: 0.56 mg/dL (ref 0.44–1.00)
GFR, Estimated: >60 mL/min (ref >60)
Glucose, Bld: 90 mg/dL (ref 70–99)
Potassium: 3.6 mmol/L (ref 3.5–5.1)
Sodium: 139 mmol/L (ref 135–145)

## 2023-11-17 LAB — URINALYSIS, ROUTINE W REFLEX MICROSCOPIC
Bacteria, UA: NONE SEEN
Bilirubin Urine: NEGATIVE
Glucose, UA: NEGATIVE mg/dL
Ketones, ur: NEGATIVE mg/dL
Nitrite: NEGATIVE
Protein, ur: 30 mg/dL — AB
RBC / HPF: >50 RBC/hpf (ref 0–5)
Specific Gravity, Urine: 1.029 (ref 1.005–1.030)
pH: 5 (ref 5.0–8.0)

## 2023-11-17 LAB — CBC WITH DIFFERENTIAL/PLATELET
Abs Immature Granulocytes: 0.03 K/uL (ref 0.00–0.07)
Basophils Absolute: 0 K/uL (ref 0.0–0.1)
Basophils Relative: 0 %
Eosinophils Absolute: 0.2 K/uL (ref 0.0–0.5)
Eosinophils Relative: 2 %
HCT: 35.2 % — ABNORMAL LOW (ref 36.0–46.0)
Hemoglobin: 11 g/dL — ABNORMAL LOW (ref 12.0–15.0)
Immature Granulocytes: 0 %
Lymphocytes Relative: 39 %
Lymphs Abs: 3.3 K/uL (ref 0.7–4.0)
MCH: 26.3 pg (ref 26.0–34.0)
MCHC: 31.3 g/dL (ref 30.0–36.0)
MCV: 84 fL (ref 80.0–100.0)
Monocytes Absolute: 0.6 K/uL (ref 0.1–1.0)
Monocytes Relative: 7 %
Neutro Abs: 4.3 K/uL (ref 1.7–7.7)
Neutrophils Relative %: 52 %
Platelets: 230 K/uL (ref 150–400)
RBC: 4.19 MIL/uL (ref 3.87–5.11)
RDW: 16.6 % — ABNORMAL HIGH (ref 11.5–15.5)
WBC: 8.4 K/uL (ref 4.0–10.5)
nRBC: 0 % (ref 0.0–0.2)

## 2023-11-17 LAB — POC URINE PREG, ED: Preg Test, Ur: NEGATIVE

## 2023-11-17 NOTE — ED Triage Notes (Signed)
 Pt reports she has been having heavy vaginal bleeding lower abd cramping and having large blood clots since earlier today. Pt reports she had her normal menstrual cycle from 10/27-11/1.

## 2023-11-18 ENCOUNTER — Emergency Department
Admission: EM | Admit: 2023-11-18 | Discharge: 2023-11-18 | Disposition: A | Discharge status: Home / Self Care | Payer: Self-pay | Attending: Emergency Medicine | Admitting: Emergency Medicine

## 2023-11-18 DIAGNOSIS — N939 Abnormal uterine and vaginal bleeding, unspecified: Secondary | ICD-10-CM

## 2023-11-18 MED ORDER — TRANEXAMIC ACID-NACL 1000-0.7 MG/100ML-% IV SOLN
1000.0000 mg | INTRAVENOUS | Status: AC
  Administered 2023-11-18: 1000 mg via INTRAVENOUS
  Filled 2023-11-18: qty 100

## 2023-11-18 MED ORDER — TRANEXAMIC ACID 650 MG PO TABS: 1300.0000 mg | ORAL_TABLET | Freq: Three times a day (TID) | ORAL | 1 refills | Status: AC

## 2023-11-18 NOTE — ED Provider Notes (Signed)
 [facility] Provider Note    Event Date/Time   First MD Initiated Contact with Patient 11/18/23 0018     (approximate)   History   Vaginal Bleeding   HPI Joanna [name] is a 23 y.o. female who presents for evaluation of heavy vaginal bleeding.  She said that she thought she completed her normal menstrual cycle about 3 to 4 days ago, but today she started bleeding again heavily and passing clots.  She says she feels more tired than usual but is not lightheaded or dizzy.  She is concerned because this is abnormal for her and she is typically quite regular.  She is not on any hormones or birth control pills and she is not trying to get pregnant.  She has had no recent medication or dietary changes.  She has no history of blood clots.  She has had increased pelvic cramping consistent with menstrual cycle, but no significant pain.     Physical Exam   Triage Vital Signs: ED Triage Vitals  Encounter Vitals Group     BP 11/17/23 2314 (!) 151/72     Girls Systolic BP Percentile --      Girls Diastolic BP Percentile --      Boys Systolic BP Percentile --      Boys Diastolic BP Percentile --      Pulse Rate 11/17/23 2314 80     Resp 11/17/23 2314 20     Temp 11/17/23 2314 98.7 F (37.1 C)     Temp src --      SpO2 11/17/23 2314 100 %     Weight 11/17/23 2313 (!) 152.9 kg (337 lb)     Height 11/17/23 2313 1.778 m (5' 10)     Head Circumference --      Peak Flow --      Pain Score 11/17/23 2313 7     Pain Loc --      Pain Education --      Exclude from Growth Chart --     Most recent vital signs: Vitals:   11/17/23 2314  BP: (!) 151/72  Pulse: 80  Resp: 20  Temp: 98.7 F (37.1 C)  SpO2: 100%    General: Awake, no distress.  Pleasant, conversant, in no distress. CV:  Good peripheral perfusion.  Resp:  Normal effort. Speaking easily and comfortably, no accessory muscle usage nor intercostal retractions.   Abd:  Obese, soft, nondistended, no  tenderness to palpation. Other:  Deferred GU exam   ED Results / Procedures / Treatments   Labs (all labs ordered are listed, but only abnormal results are displayed) Labs Reviewed  CBC WITH DIFFERENTIAL/PLATELET - Abnormal; Notable for the following components:      Result Value   Hemoglobin 11.0 (*)    HCT 35.2 (*)    RDW 16.6 (*)    All other components within normal limits  BASIC METABOLIC PANEL WITH GFR - Abnormal; Notable for the following components:   Calcium 8.6 (*)    All other components within normal limits  URINALYSIS, ROUTINE W REFLEX MICROSCOPIC - Abnormal; Notable for the following components:   Color, Urine YELLOW (*)    APPearance HAZY (*)    Hgb urine dipstick LARGE (*)    Protein, ur 30 (*)    Leukocytes,Ua TRACE (*)    All other components within normal limits  POC URINE PREG, ED      PROCEDURES:  Critical Care performed: No  Procedures  IMPRESSION / MDM / ASSESSMENT AND PLAN / ED COURSE  I reviewed the triage vital signs and the nursing notes.                              Differential diagnosis includes, but is not limited to, menometrorrhagia, pregnancy related bleeding, fibroids.  Patient's presentation is most consistent with acute presentation with potential threat to life or bodily function.  Labs/studies ordered: Urinalysis, BMP, CBC with differential, urine pregnancy test  Interventions/Medications given:  Medications  tranexamic acid (CYKLOKAPRON) IVPB 1,000 mg (has no administration in time range)    (Note:  hospital course my include additional interventions and/or labs/studies not listed above.)   Patient's vitals are stable other than some mild hypertension.  Hemoglobin is essentially normal at 11.  Patient has hematuria which is almost certainly the result of her vaginal bleeding.  Metabolic panel is normal.  Most importantly urine pregnancy test is negative.  Patient is in no distress and essentially asymptomatic other  than the menometrorrhagia.  We talked about it and she is comfortable deferring GU exam given that we will likely not contribute to the current treatment plan and she would rather go home and follow-up as an outpatient then wait for an ultrasound tonight that we will likely not result in any specific treatment recommendations.  We talked about the possibilities of fibroids.  She just recently moved back to this area and would like to establish care with an OB/GYN clinic so I gave her follow-up information; she would like to follow-up with [facility].  We talked about treatment options including birth control pills versus Lysteda, and she would prefer the Lysteda.  I am giving her a dose of IV TXA 1000 mg tonight as per her preference and a prescription for Lysteda with 1 refill for next month if she has additional bleeding.  She will establish care as an outpatient and I gave my usual and customary follow-up recommendations and return precautions.         FINAL CLINICAL IMPRESSION(S) / ED DIAGNOSES   Final diagnoses:  Abnormal uterine bleeding (AUB)     Rx / DC Orders   ED Discharge Orders          Ordered    tranexamic acid (LYSTEDA) 650 MG TABS tablet  3 times daily        11/18/23 0113             Note:  This document was prepared using Dragon voice recognition software and may include unintentional dictation errors.   Gordan Huxley, MD 11/18/23 [phone]

## 2023-11-18 NOTE — Discharge Instructions (Addendum)
 As we discussed, although you are having heavy vaginal bleeding, it is not dangerous at this time.  The plan at this time is for you to start taking the prescribed medication as instructed on the bottle and to follow-up with the GYN doctor indicated in this document.  Please return to the emergency department if you develop any new or worsening symptoms that concern you.  Remember that tobacco use greatly increases your risk of developing blood clots while taking birth control pills and other pro-coagulation medication like Lysteda, so please avoid smoking or using any other tobacco products.

## 2024-01-13 ENCOUNTER — Encounter: Payer: Self-pay | Admitting: Emergency Medicine

## 2024-01-13 ENCOUNTER — Other Ambulatory Visit: Payer: Self-pay

## 2024-01-13 ENCOUNTER — Emergency Department
Admission: EM | Admit: 2024-01-13 | Discharge: 2024-01-13 | Disposition: A | Discharge status: Home / Self Care | Payer: Self-pay | Attending: Emergency Medicine | Admitting: Emergency Medicine

## 2024-01-13 DIAGNOSIS — J45909 Unspecified asthma, uncomplicated: Secondary | ICD-10-CM | POA: Insufficient documentation

## 2024-01-13 DIAGNOSIS — J111 Influenza due to unidentified influenza virus with other respiratory manifestations: Secondary | ICD-10-CM | POA: Insufficient documentation

## 2024-01-13 MED ORDER — IBUPROFEN 600 MG PO TABS
600.0000 mg | ORAL_TABLET | Freq: Once | ORAL | Status: AC
  Administered 2024-01-13: 600 mg via ORAL
  Filled 2024-01-13: qty 1

## 2024-01-13 MED ORDER — PROMETHAZINE-DM 6.25-15 MG/5ML PO SYRP: 5.0000 mL | ORAL_SOLUTION | Freq: Four times a day (QID) | ORAL | 0 refills | Status: AC | PRN

## 2024-01-13 NOTE — ED Triage Notes (Signed)
 Pt endorses chills, cough, body aches, sore throat starting yesterday.

## 2024-01-13 NOTE — ED Provider Notes (Signed)
" ° °  [facility] Provider Note    Event Date/Time   First MD Initiated Contact with Patient 01/13/24 1935     (approximate)   History   Cough and Sore Throat   HPI  Joanna [name] is a 24 y.o. female with history of asthma and as listed in EMR presents to the emergency department for treatment and evaluation of chills, cough, body aches, sore throat, and fever that started last night.  Her daughter recently had influenza.SABRA     Physical Exam    Vitals:   01/13/24 1816  BP: (!) 131/118  Pulse: 90  Resp: 16  Temp: (!) 100.7 F (38.2 C)  SpO2: 97%    General: Awake, no distress.  CV:  Good peripheral perfusion.  Resp:  Normal effort.  Clear to auscultation Abd:  No distention.  Other:  Posterior oropharynx is erythematous tonsils are not enlarged and no exudate   ED Results / Procedures / Treatments   Labs (all labs ordered are listed, but only abnormal results are displayed)  Labs Reviewed - No data to display   EKG  Not indicated   RADIOLOGY  Image and radiology report reviewed and interpreted by me. Radiology report consistent with the same.  Not indicated  PROCEDURES:  Critical Care performed: No  Procedures   MEDICATIONS ORDERED IN ED:  Medications  ibuprofen  (ADVIL ) tablet 600 mg (has no administration in time range)     IMPRESSION / MDM / ASSESSMENT AND PLAN / ED COURSE   I have reviewed the triage note and vital signs. Vital signs are stable   Differential diagnosis includes, but is not limited to, COVID, influenza, RSV, acute viral syndrome  Patient's presentation is most consistent with acute illness / injury with system symptoms.  24 year old female presenting to the emergency department for treatment and evaluation of flulike symptoms.  Influenza is prevalent in the community at this time and patient has been exposed therefore this was explained to her symptoms and fever.  Plan will be to give her some  ibuprofen  while she is here and have her rotate Tylenol  and ibuprofen  at home.  Prescription for cough medication submitted to the patient's pharmacy and she was provided with a work note for the next few days as well.  Outpatient follow-up encouraged if she is not improving over the week and ER return precautions discussed.      FINAL CLINICAL IMPRESSION(S) / ED DIAGNOSES   Final diagnoses:  Influenza     Rx / DC Orders   ED Discharge Orders          Ordered    promethazine -dextromethorphan (PROMETHAZINE -DM) 6.25-15 MG/5ML syrup  4 times daily PRN        01/13/24 2006             Note:  This document was prepared using Dragon voice recognition software and may include unintentional dictation errors.   Herlinda Kirk NOVAK, FNP 01/13/24 2012    Jacolyn Pae, MD 01/13/24 2215  "

## 2024-01-13 NOTE — Discharge Instructions (Addendum)
 Please follow-up with your primary care provider if your symptoms are not improving over the week.  Take tylenol  or ibuprofen  for pain and fever.  Return to the emergency department for symptoms of change or worsen if unable to schedule appointment.

## 2024-01-17 ENCOUNTER — Other Ambulatory Visit: Payer: Self-pay

## 2024-01-17 ENCOUNTER — Emergency Department
Admission: EM | Admit: 2024-01-17 | Discharge: 2024-01-17 | Disposition: A | Discharge status: Home / Self Care | Payer: Self-pay | Attending: Emergency Medicine | Admitting: Emergency Medicine

## 2024-01-17 DIAGNOSIS — J111 Influenza due to unidentified influenza virus with other respiratory manifestations: Secondary | ICD-10-CM | POA: Insufficient documentation

## 2024-01-17 DIAGNOSIS — J45909 Unspecified asthma, uncomplicated: Secondary | ICD-10-CM | POA: Insufficient documentation

## 2024-01-17 MED ORDER — PREDNISONE 10 MG PO TABS: 50.0000 mg | ORAL_TABLET | Freq: Every day | ORAL | 0 refills | Status: AC

## 2024-01-17 NOTE — ED Triage Notes (Signed)
"   Pt comes with cough body aches loss of taste and sore throat for the last few days. Pt pretty sure she has covid.  "

## 2024-01-17 NOTE — ED Provider Notes (Signed)
" ° °  [facility] Provider Note    Event Date/Time   First MD Initiated Contact with Patient 01/17/24 1013     (approximate)   History   Fever   HPI  Joanna [name] is a 24 y.o. female with history of asthma, migraine, bipolar 1 and as listed in EMR presents to the emergency department for treatment and evaluation of bodyaches, loss of taste and sore throat for the past few days.  Patient is concerned that she has COVID.  She has had influenza A exposure.   Physical Exam    Vitals:   01/17/24 1023  BP: (!) 148/88  Pulse: 86  Resp: 18  Temp: (!) 97.4 F (36.3 C)  SpO2: 100%    General: Awake, no distress.  CV:  Good peripheral perfusion.  Resp:  Normal effort.  Breath sounds clear to auscultation. Abd:  No distention.  Other:     ED Results / Procedures / Treatments   Labs (all labs ordered are listed, but only abnormal results are displayed)  Labs Reviewed - No data to display   EKG  Not indicated   RADIOLOGY  Image and radiology report reviewed and interpreted by me. Radiology report consistent with the same.  Not indicated  PROCEDURES:  Critical Care performed: No  Procedures   MEDICATIONS ORDERED IN ED:  Medications - No data to display   IMPRESSION / MDM / ASSESSMENT AND PLAN / ED COURSE   I have reviewed the triage note and vital signs. Vital signs are stable   Differential diagnosis includes, but is not limited to, influenza, COVID, viral syndrome  Patient's presentation is most consistent with acute illness / injury with system symptoms.  24 year old female presenting for second time for treatment and evaluation of viral symptoms as described in the HPI.  Patient's main concern is that she has had a decrease in the ability to smell and taste.  The symptoms have been ongoing for several days.  She is outside the window for testing and/or Tamiflu treatment.  We discussed that she needs to continue Tylenol ,  ibuprofen , and increase fluids. Prednisone  submitted to her pharmacy to help clear the congestion.      FINAL CLINICAL IMPRESSION(S) / ED DIAGNOSES   Final diagnoses:  Influenza-like illness     Rx / DC Orders   ED Discharge Orders          Ordered    predniSONE  (DELTASONE ) 10 MG tablet  Daily        01/17/24 1033             Note:  This document was prepared using Dragon voice recognition software and may include unintentional dictation errors.   Herlinda Kirk NOVAK, FNP 01/17/24 1528    Arlander Charleston, MD 01/18/24 [phone]  "

## 2024-01-17 NOTE — Discharge Instructions (Signed)
 Continue Tylenol  or ibuprofen .  Follow up with primary care if not improving over the next few days.
